# Patient Record
Sex: Female | Born: 1995 | Race: Black or African American | Hispanic: No | Marital: Single | Smoking: Current some day smoker
Health system: Southern US, Community
[De-identification: ages and names within clinical notes are randomized; demographics above are authoritative.]

## PROBLEM LIST (undated history)

## (undated) ENCOUNTER — Inpatient Hospital Stay (HOSPITAL_COMMUNITY): Payer: Self-pay

## (undated) ENCOUNTER — Inpatient Hospital Stay (HOSPITAL_COMMUNITY): Payer: Medicaid Other

## (undated) DIAGNOSIS — N390 Urinary tract infection, site not specified: Secondary | ICD-10-CM

## (undated) DIAGNOSIS — J45909 Unspecified asthma, uncomplicated: Secondary | ICD-10-CM

## (undated) DIAGNOSIS — O26899 Other specified pregnancy related conditions, unspecified trimester: Secondary | ICD-10-CM

## (undated) HISTORY — PX: NO PAST SURGERIES: SHX2092

## (undated) HISTORY — DX: Other specified pregnancy related conditions, unspecified trimester: O26.899

---
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
## 2015-01-12 ENCOUNTER — Ambulatory Visit: Payer: Self-pay | Admitting: Family

## 2015-03-05 ENCOUNTER — Encounter (HOSPITAL_COMMUNITY): Payer: Self-pay | Admitting: Emergency Medicine

## 2015-03-05 ENCOUNTER — Emergency Department (HOSPITAL_COMMUNITY)
Admission: EM | Admit: 2015-03-05 | Discharge: 2015-03-05 | Disposition: A | Discharge status: Home / Self Care | Payer: Medicaid Other | Attending: Emergency Medicine | Admitting: Emergency Medicine

## 2015-03-05 DIAGNOSIS — K029 Dental caries, unspecified: Secondary | ICD-10-CM | POA: Insufficient documentation

## 2015-03-05 DIAGNOSIS — Z87440 Personal history of urinary (tract) infections: Secondary | ICD-10-CM | POA: Diagnosis not present

## 2015-03-05 DIAGNOSIS — K047 Periapical abscess without sinus: Secondary | ICD-10-CM | POA: Diagnosis not present

## 2015-03-05 DIAGNOSIS — K0889 Other specified disorders of teeth and supporting structures: Secondary | ICD-10-CM | POA: Diagnosis present

## 2015-03-05 DIAGNOSIS — Z87891 Personal history of nicotine dependence: Secondary | ICD-10-CM | POA: Diagnosis not present

## 2015-03-05 DIAGNOSIS — J45909 Unspecified asthma, uncomplicated: Secondary | ICD-10-CM | POA: Diagnosis not present

## 2015-03-05 MED ORDER — AMOXICILLIN 250 MG PO CAPS
500.0000 mg | ORAL_CAPSULE | Freq: Once | ORAL | Status: AC
  Administered 2015-03-05: 500 mg via ORAL
  Filled 2015-03-05: qty 2

## 2015-03-05 MED ORDER — AMOXICILLIN 500 MG PO CAPS: 500.0000 mg | ORAL_CAPSULE | Freq: Three times a day (TID) | ORAL | Status: AC

## 2015-03-05 MED ORDER — TRAMADOL HCL 50 MG PO TABS
50.0000 mg | ORAL_TABLET | Freq: Once | ORAL | Status: AC
  Administered 2015-03-05: 50 mg via ORAL
  Filled 2015-03-05: qty 1

## 2015-03-05 MED ORDER — TRAMADOL HCL 50 MG PO TABS: 50.0000 mg | ORAL_TABLET | Freq: Four times a day (QID) | ORAL | Status: DC | PRN

## 2015-03-05 NOTE — ED Provider Notes (Signed)
CSN: [id number]     Arrival date & time 03/05/15  4098 History   First MD Initiated Contact with Patient 03/05/15 1058     Chief Complaint  Patient presents with  . Dental Pain     (Consider location/radiation/quality/duration/timing/severity/associated sxs/prior Treatment) The history is provided by the patient.   Ashley Morris is a 19 y.o. female presenting with right upper dental pain which has been severe for the past several days.  She is 3 months post partum (not breast feeding) and had to hold off with dental care while pregnant and really had no significant pain symptoms until now.  She denies fevers, chills, headache, drainage from around the tooth but has noticed increased gingival swelling.  She also has a cavity in the left upper side but is not currently causing symptoms.  She has taken tylenol without pain relief.    Past Medical History  Diagnosis Date  . Asthma   . Urinary tract infection    Past Surgical History  Procedure Laterality Date  . No past surgeries     Family History  Problem Relation Age of Onset  . Hypertension Mother   . Anemia Mother    Social History  Substance Use Topics  . Smoking status: Former Smoker    Types: Cigarettes    Quit date: 11/08/2012  . Smokeless tobacco: Never Used  . Alcohol Use: No   OB History    Gravida Para Term Preterm AB TAB SAB Ectopic Multiple Living   0 2     Review of Systems  Constitutional: Negative for fever.  HENT: Positive for dental problem. Negative for facial swelling and sore throat.   Respiratory: Negative for shortness of breath.   Musculoskeletal: Negative for neck pain and neck stiffness.      Allergies  Review of patient's allergies indicates no known allergies.  Home Medications   Prior to Admission medications   Medication Sig Start Date End Date Taking? Authorizing Provider  acetaminophen (TYLENOL) 500 MG tablet Take 1,000 mg by mouth every 6 (six) hours as needed  for mild pain.   Yes Historical Provider, MD  amoxicillin (AMOXIL) 500 MG capsule Take 1 capsule (500 mg total) by mouth 3 (three) times daily. 03/05/15 03/15/15  Burgess Amor, PA-C  ibuprofen (ADVIL,MOTRIN) 600 MG tablet Take 1 tablet (600 mg total) by mouth every 6 (six) hours. Patient not taking: Reported on 03/05/2015 12/01/14   Aviva Signs, CNM  traMADol (ULTRAM) 50 MG tablet Take 1 tablet (50 mg total) by mouth every 6 (six) hours as needed. 03/05/15   Burgess Amor, PA-C   BP 140/85 mmHg  Pulse 90  Temp(Src) 98.3 F (36.8 C) (Oral)  Resp 16  Ht  (1.702 m)  Wt 182 lb 9.6 oz (82.827 kg)  BMI 28.59 kg/m2  SpO2 100% Physical Exam  Constitutional: She is oriented to person, place, and time. She appears well-developed and well-nourished. No distress.  HENT:  Head: Normocephalic and atraumatic.  Right Ear: Tympanic membrane and external ear normal.  Left Ear: Tympanic membrane and external ear normal.  Mouth/Throat: Oropharynx is clear and moist and mucous membranes are normal. No oral lesions. No trismus in the jaw. Dental abscesses present.    Occlusive surface cavities bilateral 1st upper molar teeth.  Right upper lateral gingival edema.  Eyes: Conjunctivae are normal.  Neck: Normal range of motion. Neck supple.  Cardiovascular: Normal rate and normal heart sounds.  Pulmonary/Chest: Effort normal.  Abdominal: She exhibits no distension.  Musculoskeletal: Normal range of motion.  Lymphadenopathy:    She has no cervical adenopathy.  Neurological: She is alert and oriented to person, place, and time.  Skin: Skin is warm and dry. No erythema.  Psychiatric: She has a normal mood and affect.    ED Course  Procedures (including critical care time) Labs Review Labs Reviewed - No data to display  Imaging Review No results found. I have personally reviewed and evaluated these images and lab results as part of my medical decision-making.   EKG Interpretation None       MDM   Final diagnoses:  Dental cavities    Multiple cavities with probable early infection #2 molar. Amoxil, tramadol prescribed.  She is scheduled to see a dentist in [place] first week of Nov.  Prn f/u anticipated.  No trismus, no abscess at this time.  The patient appears reasonably screened and/or stabilized for discharge and I doubt any other medical condition or other [facility] requiring further screening, evaluation, or treatment in the ED at this time prior to discharge.     Burgess AmorJulie Floye Fesler, PA-C 03/05/15 1638  Benjiman CoreNathan Pickering, MD 03/06/15 1256

## 2015-03-05 NOTE — Discharge Instructions (Signed)
Dental Caries Dental caries is tooth decay. This decay can cause a hole in teeth (cavity) that can get bigger and deeper over time. HOME CARE  Brush and floss your teeth. Do this at least two times a day.  Use a fluoride toothpaste.  Use a mouth rinse if told by your dentist or doctor.  Eat less sugary and starchy foods. Drink less sugary drinks.  Avoid snacking often on sugary and starchy foods. Avoid sipping often on sugary drinks.  Keep regular checkups and cleanings with your dentist.  Use fluoride supplements if told by your dentist or doctor.  Allow fluoride to be applied to teeth if told by your dentist or doctor.   This information is not intended to replace advice given to you by your health care provider. Make sure you discuss any questions you have with your health care provider.   As discussed,  I suspect you do have an early dental abscess given your increased pain.  Look for dental putty at the drug store which can help relieve your dental nerve pain until you can be seen by your new dentist.  You may take the tramadol prescribed for pain relief.  This will make you drowsy - do not drive within 4 hours of taking this medication.

## 2015-03-05 NOTE — ED Notes (Signed)
Pt reports dental pain in upper right and left teeth stating she has "holes in them."  This has been going on for months.  Pt has inc pain today, has root canal on Thursday, but not on teeth that she is c/o today.

## 2015-03-23 ENCOUNTER — Encounter: Payer: Self-pay | Admitting: *Deleted

## 2015-04-06 IMAGING — US US OB COMP LESS 14 WK
1 series · 14 of 24 positions shown · non-contrast
Comparison: None.

CLINICAL DATA: Sudden onset of right-sided abdominal pain.

OBSTETRIC <14 WK ULTRASOUND
TECHNIQUE: Transabdominal ultrasound was performed for evaluation
of the gestation as well as the maternal uterus and adnexal
regions.

[Series 1: us ob comp less 14 wk · 0.30mm/px · 14 of 24 slices shown]
[im 1/24]
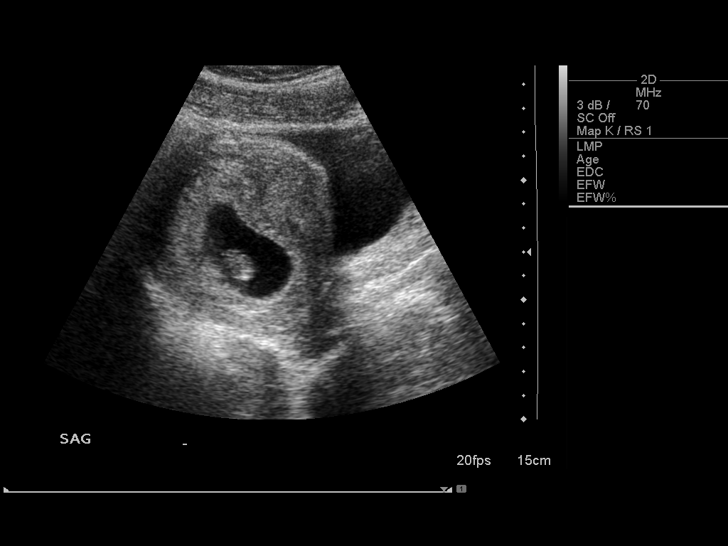
[im 3/24]
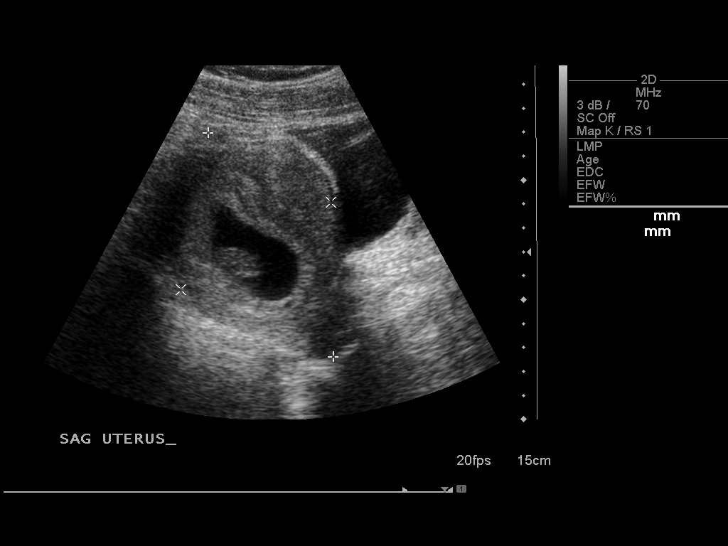
[im 5/24]
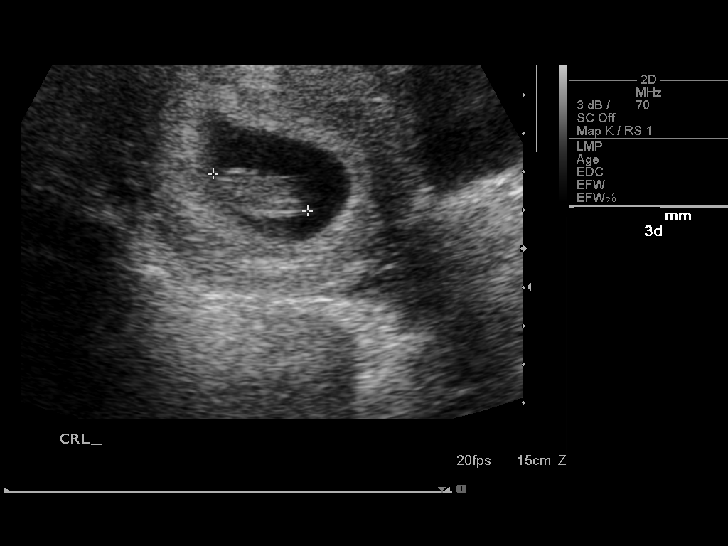
[im 7/24]
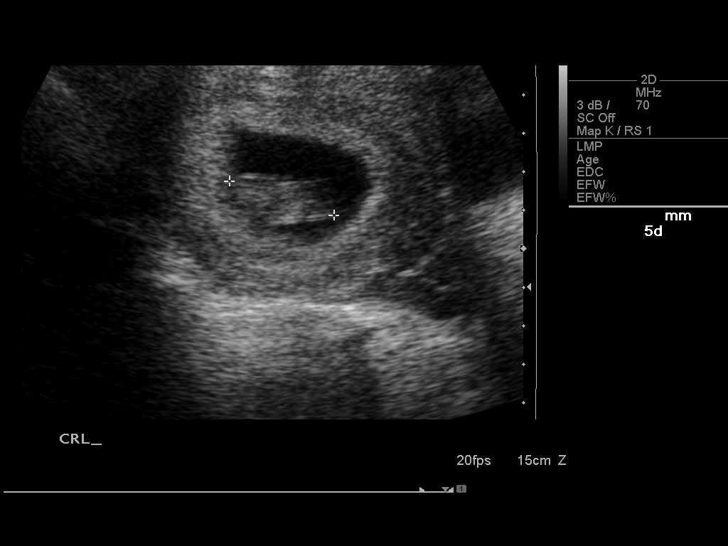
[im 8/24]
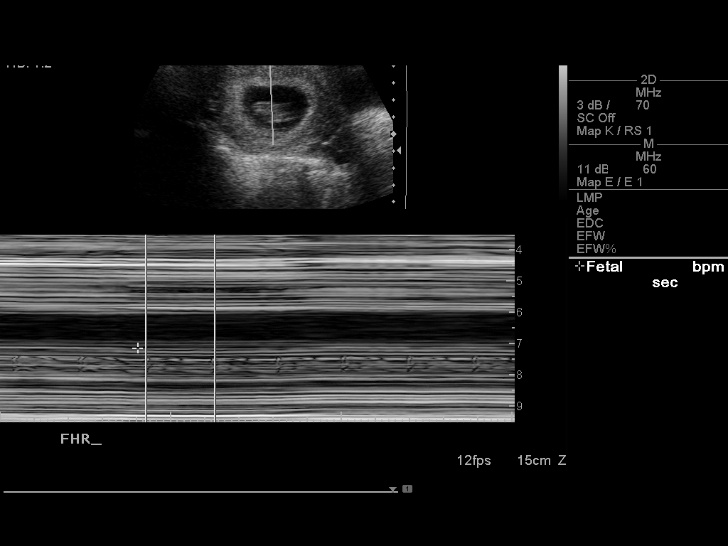
[im 10/24]
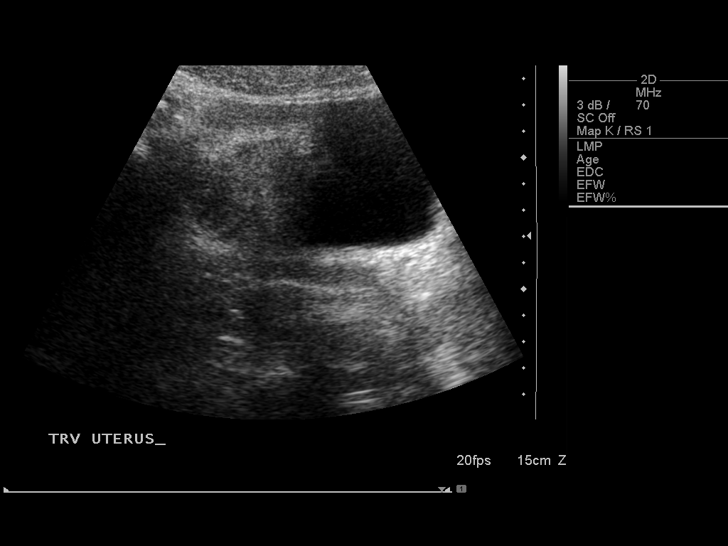
[im 12/24]
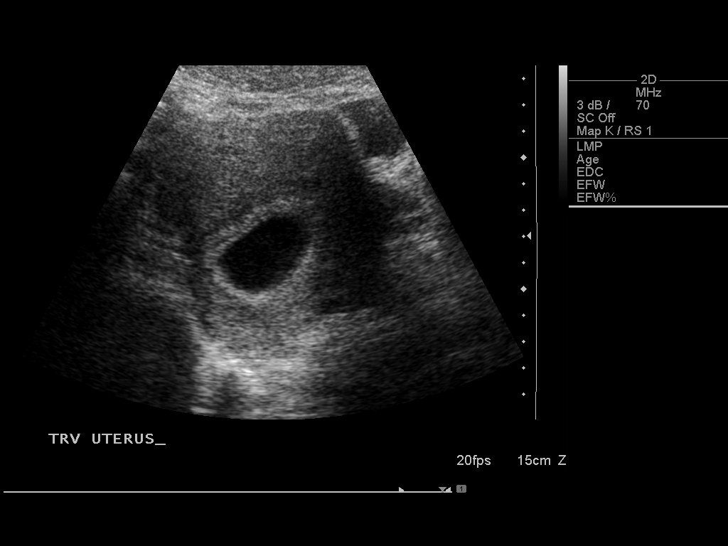
[im 13/24]
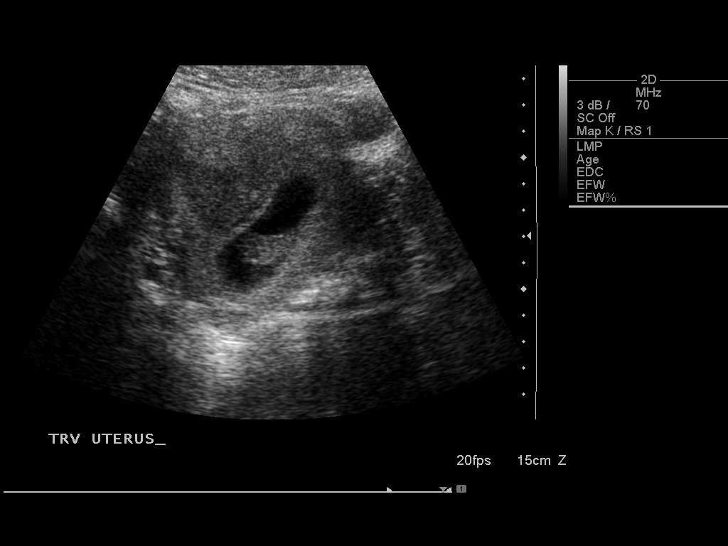
[im 15/24]
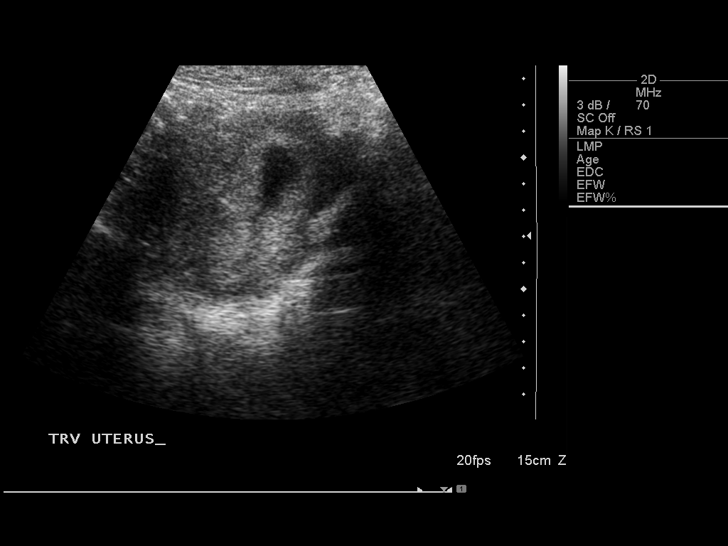
[im 17/24]
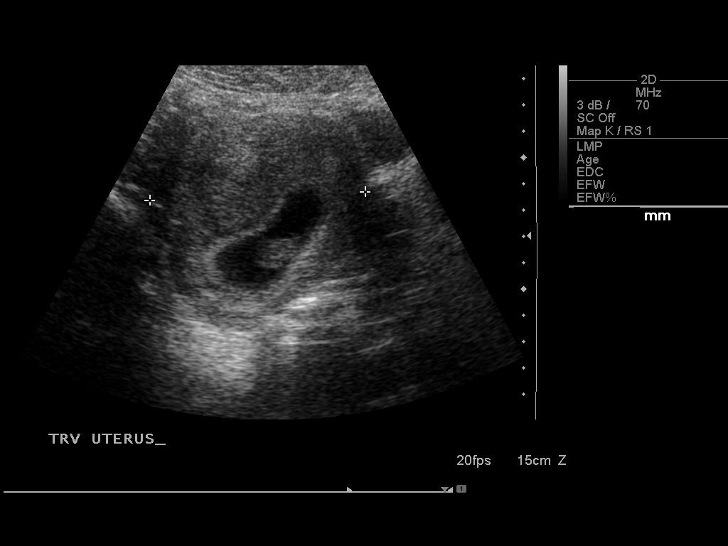
[im 19/24]
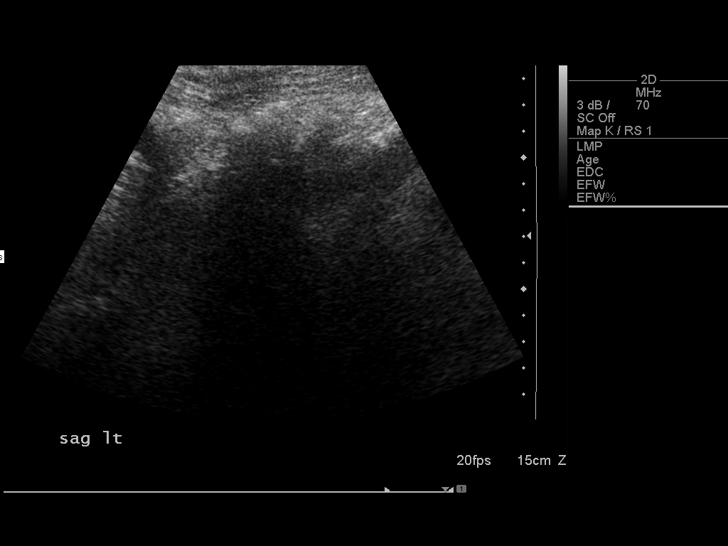
[im 20/24]
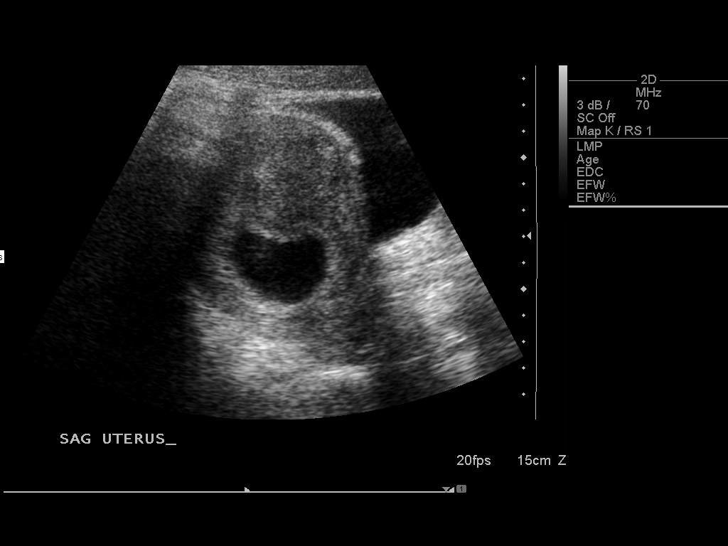
[im 22/24]
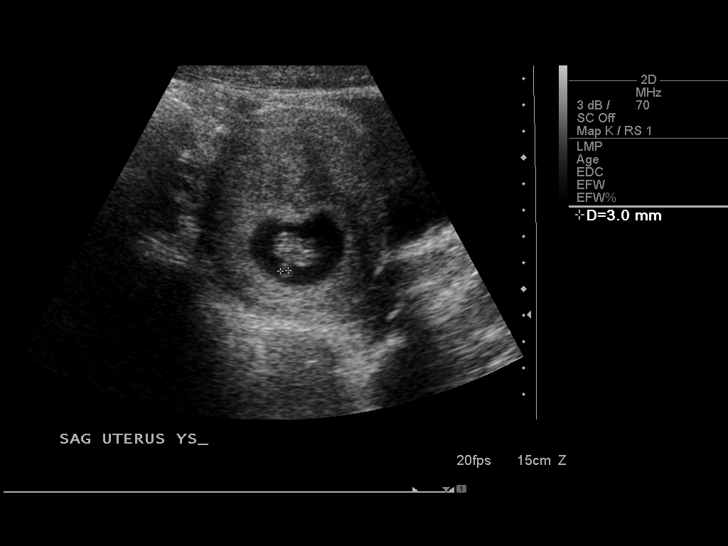
[im 24/24]
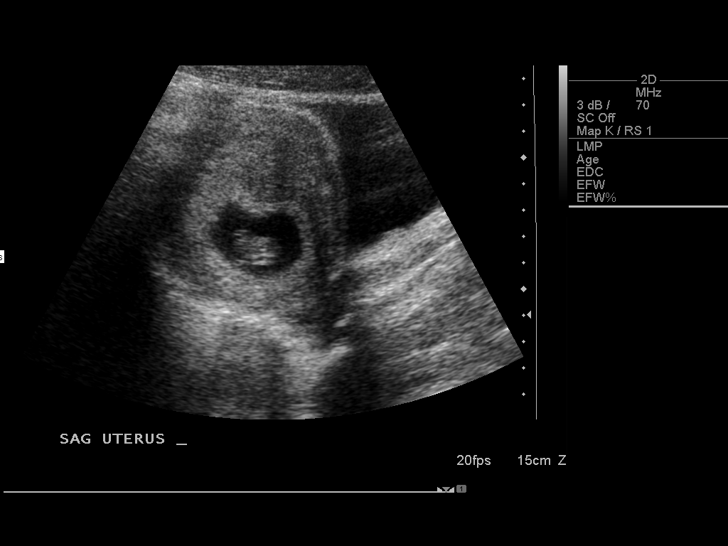

[14 of 24 positions shown; findings below may reference images not displayed]

Intrauterine gestational sac: Visualized/normal in shape.
Yolk sac: Yes
Embryo: Yes
Cardiac Activity: Yes
Heart Rate: 149 bpm

CRL:  27.5 mm  9 w  4 d       US EDC: 07/02/2013

Maternal uterus/Adnexae:
No subchorionic hemorrhage is noted.  The uterus is unremarkable in
appearance, measuring 10.7 x 7.3 x 8.2 cm.

The ovaries are not visualized on the study.  No suspicious adnexal
masses are seen; there is no evidence to suggest ovarian torsion.

No free fluid is seen in the pelvic cul-de-sac.
IMPRESSION: Single live intrauterine pregnancy noted, with a crown-rump length
of 2.8 cm, corresponding to a gestational age of 9 weeks 4 days.
This matches the gestational age of 9 weeks 2 days by LMP,
reflecting an estimated date of delivery July 04, 2013.

## 2015-06-03 IMAGING — US US ABDOMEN COMPLETE
1 series · 14 of 25 positions shown · non-contrast
Comparison: 12/01/2012.

CLINICAL DATA: Right flank pain.

ABDOMINAL ULTRASOUND COMPLETE

[Series 1: us abdomen complete · 0.26mm/px · 14 of 47 slices shown]
[im 1/47]
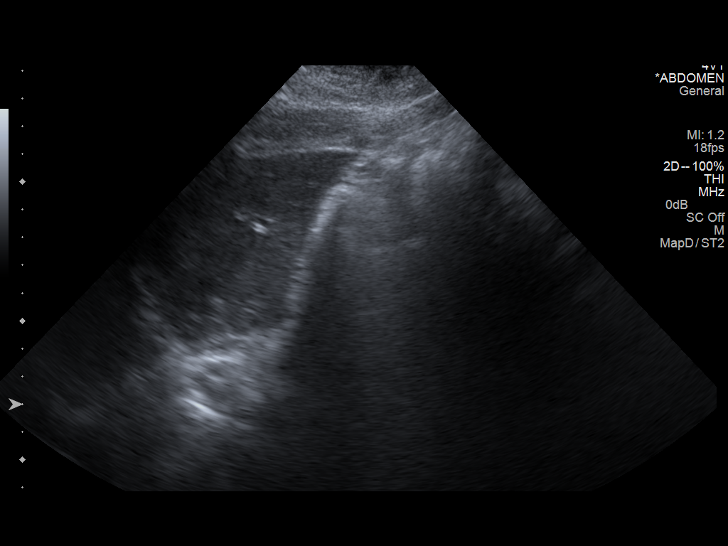
[im 4/47]
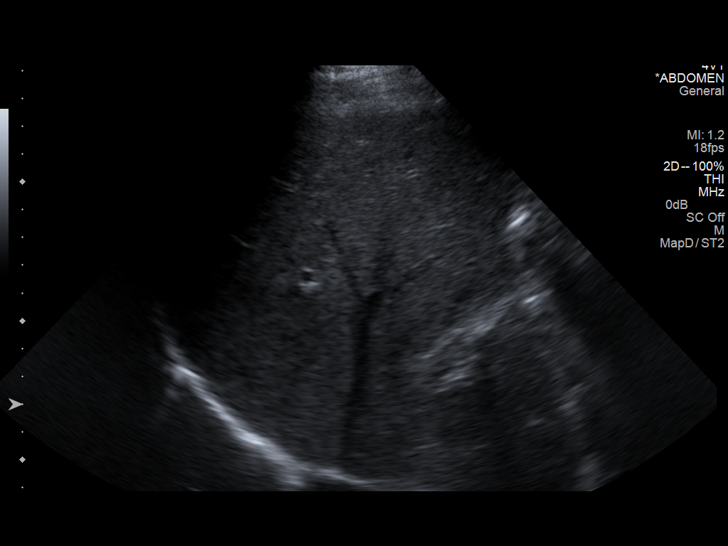
[im 8/47]
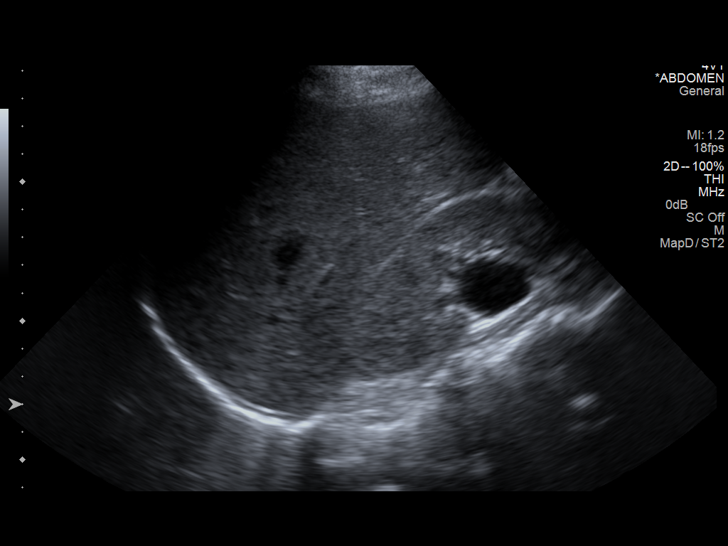
[im 12/47]
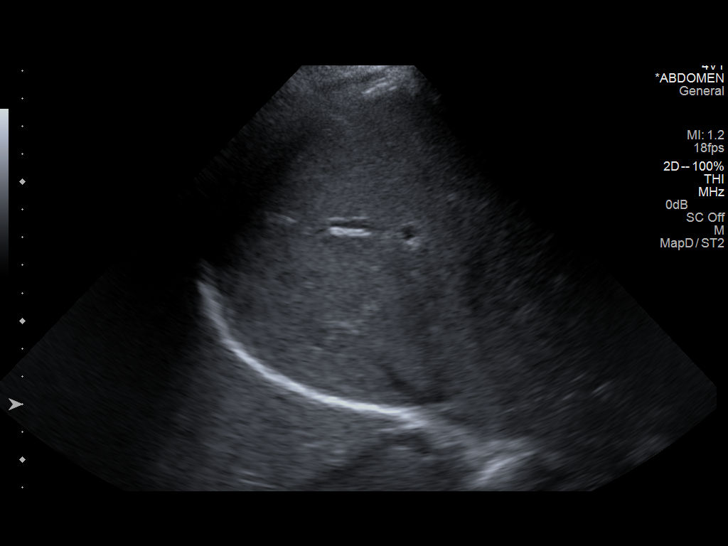
[im 16/47]
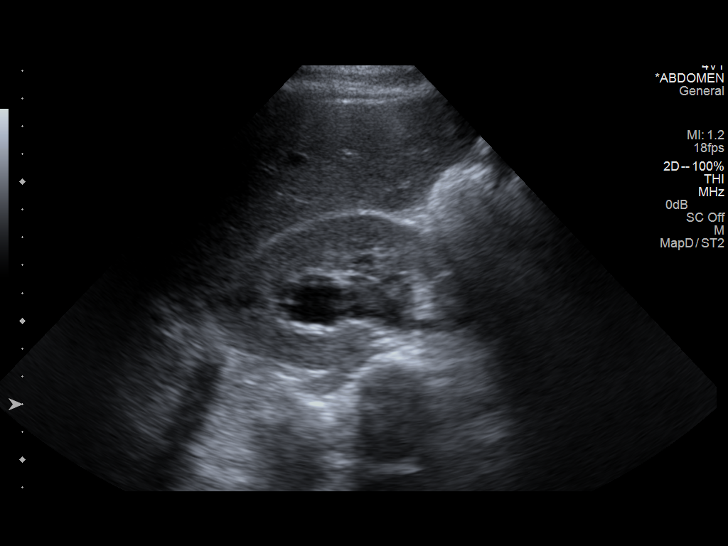
[im 18/47]
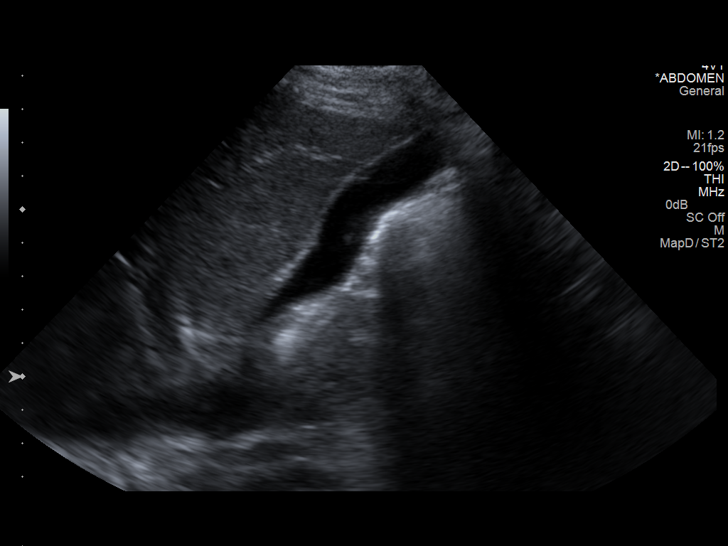
[im 22/47]
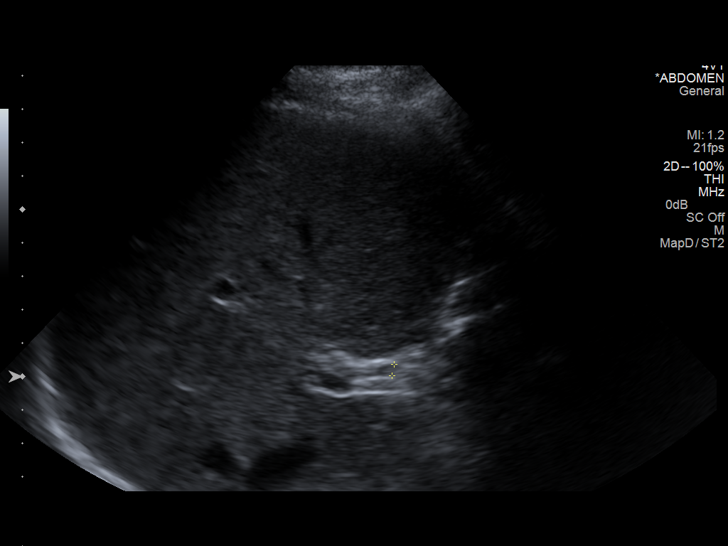
[im 25/47]
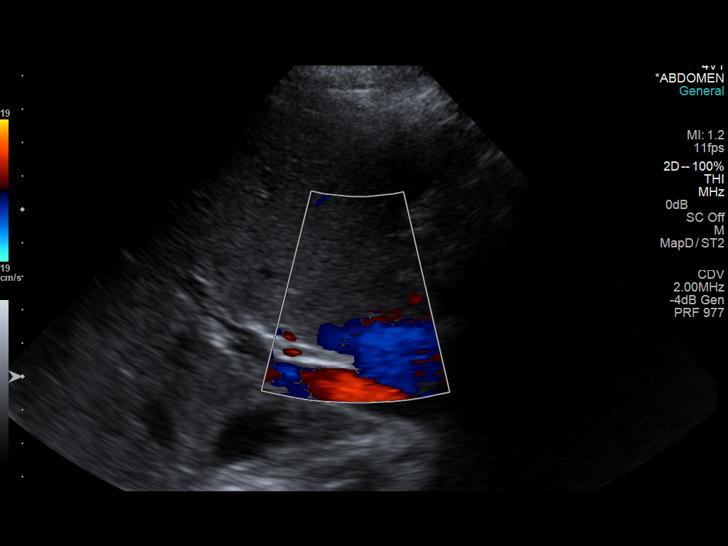
[im 29/47]
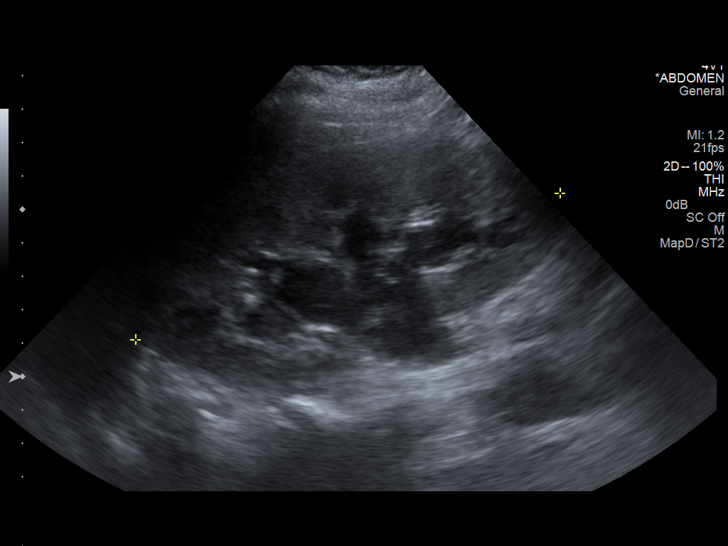
[im 31/47]
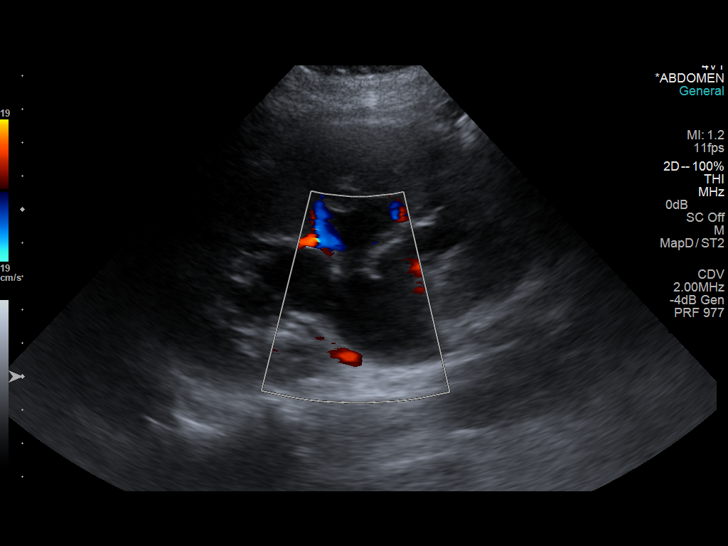
[im 35/47]
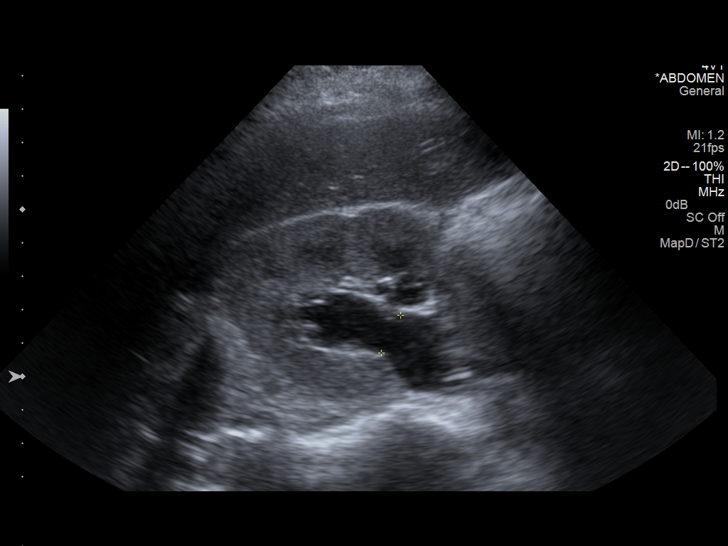
[im 39/47]
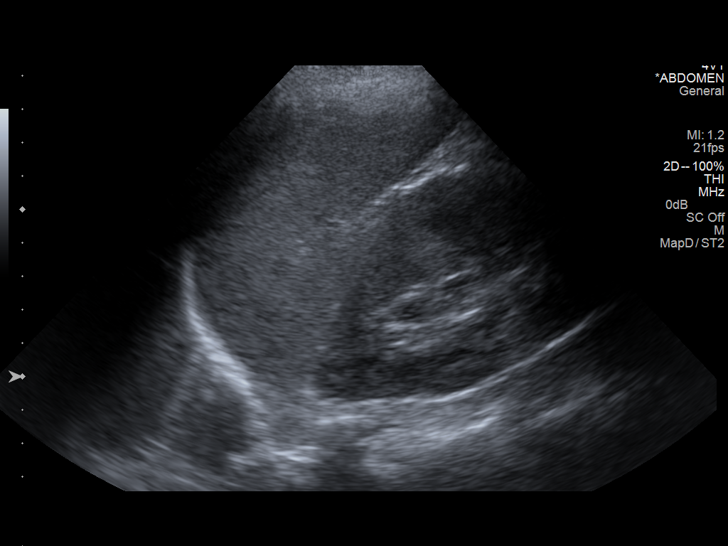
[im 43/47]
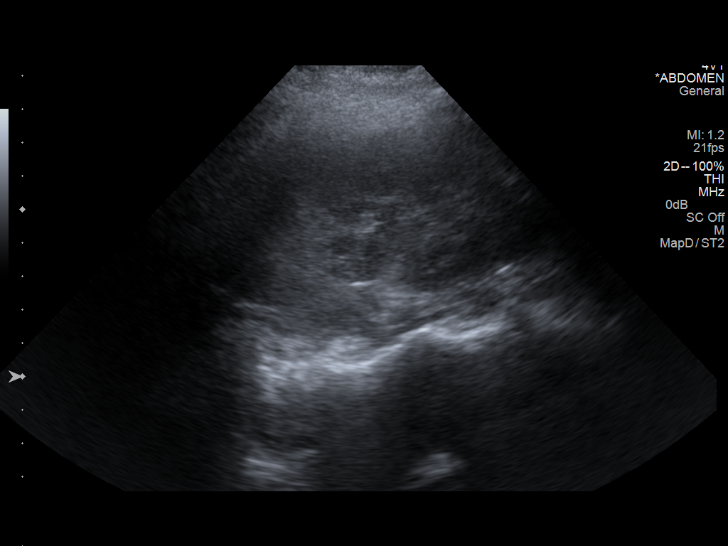
[im 47/47]
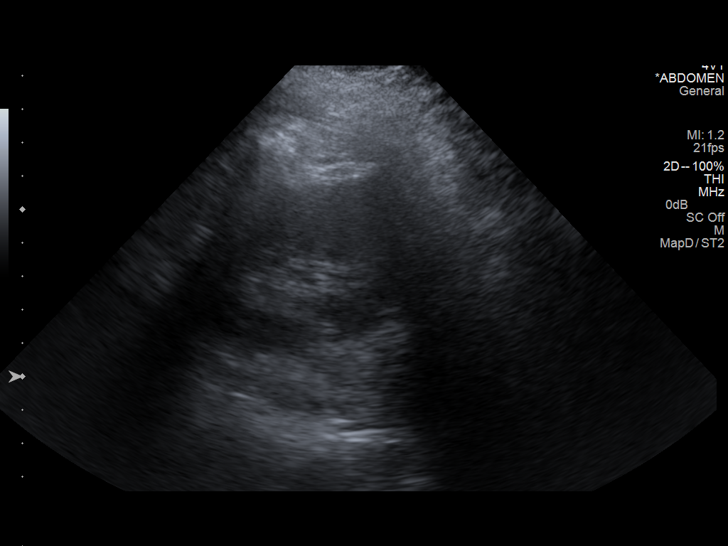

[14 of 25 positions shown; findings below may reference images not displayed]

FINDINGS: Gallbladder:  No gallstones, gallbladder wall thickening, or
pericholecystic fluid.

Common Bile Duct:  Within normal limits in caliber.

Liver: No focal mass lesion identified.  Within normal limits in
parenchymal echogenicity.

IVC:  Appears normal.

Pancreas:  No abnormality identified.

Spleen:  Within normal limits in size and echotexture.

Right kidney:  Measures 13.5 cm.  The right kidney is increased in
echogenicity and there is moderate right hydronephrosis. No
evidence of mass or hydronephrosis.

Left kidney:  Measures 11.4 cm.  Normal in size and parenchymal
echogenicity.  No evidence of mass or hydronephrosis.

Abdominal Aorta:  No aneurysm identified.
IMPRESSION: 1.  Right-sided hydronephrosis.

## 2015-06-03 IMAGING — US US OB LIMITED
1 series · 14 of 25 positions shown · non-contrast
Comparison: none

CLINICAL DATA: Abdominal pain.

EXAM:
LIMITED OBSTETRIC ULTRASOUND

[Series 1: us ob limited · 0.22mm/px · 14 of 25 slices shown]
[im 1/25]
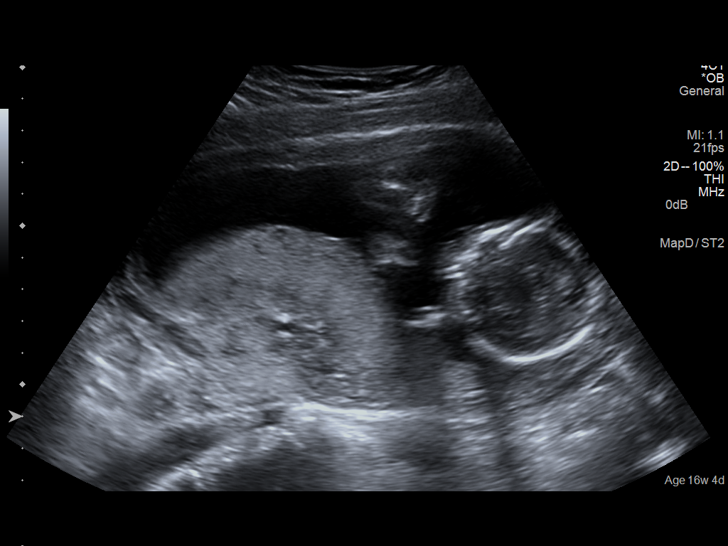
[im 3/25]
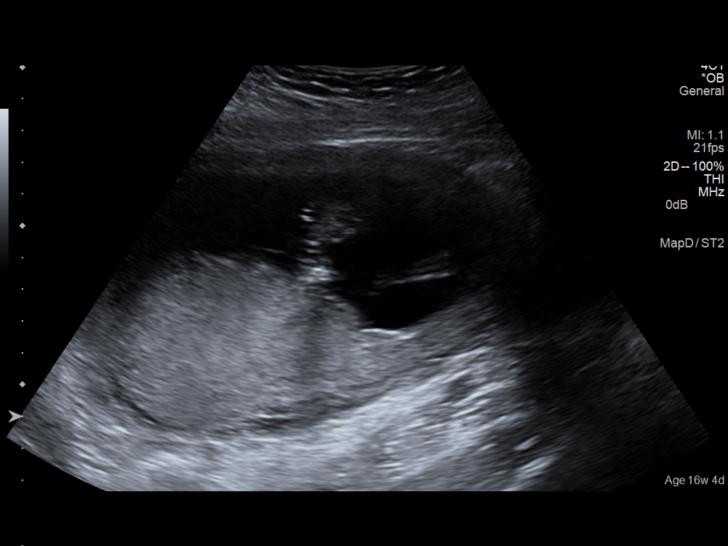
[im 5/25]
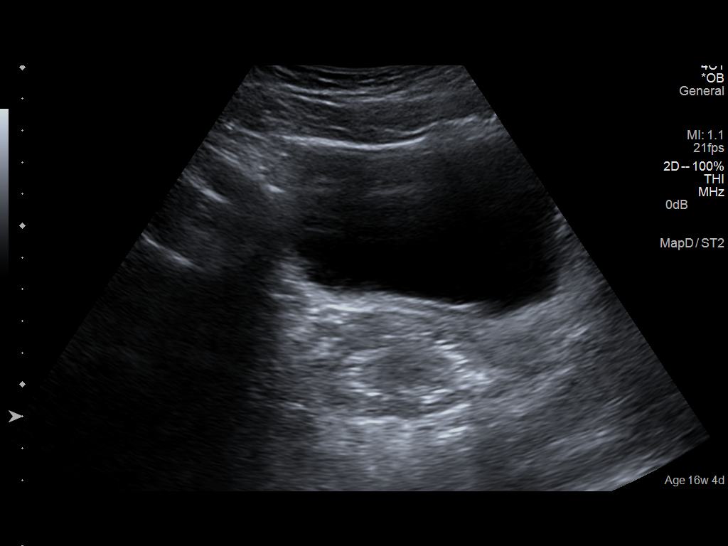
[im 7/25]
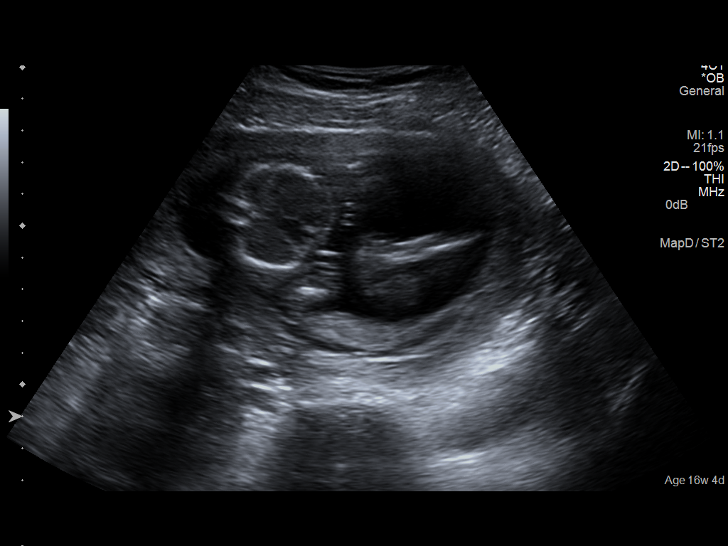
[im 9/25]
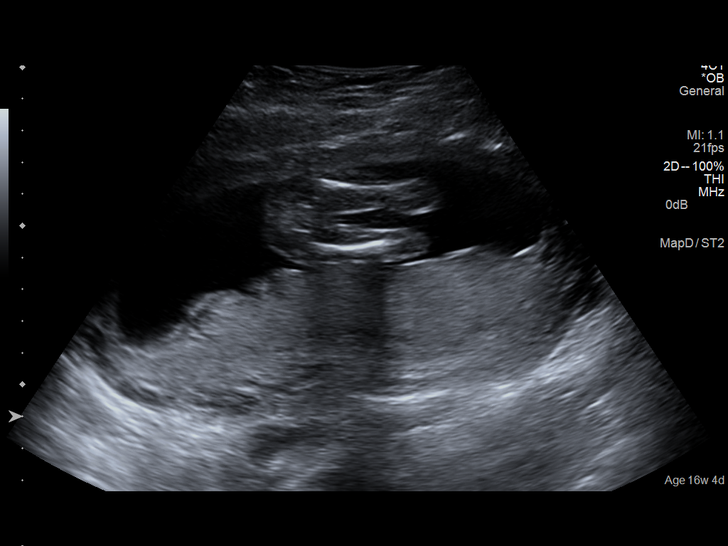
[im 10/25]
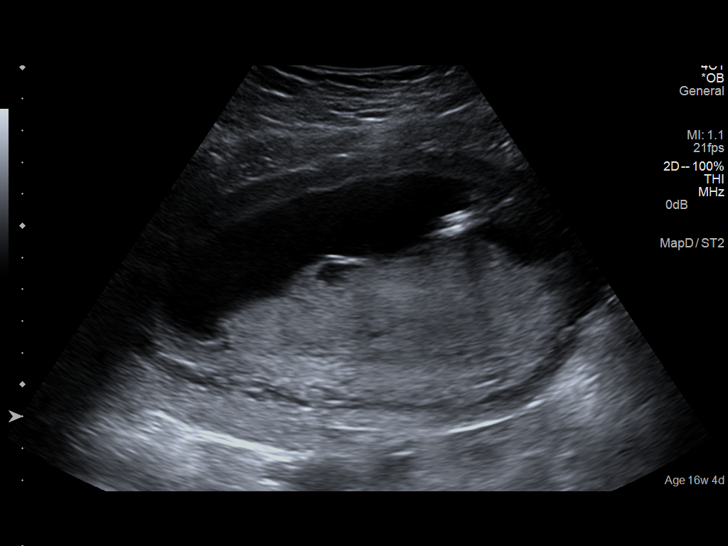
[im 12/25]
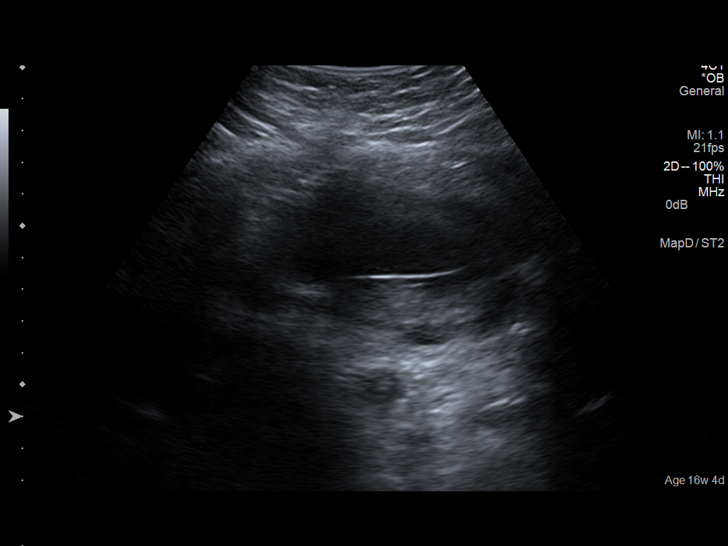
[im 14/25]
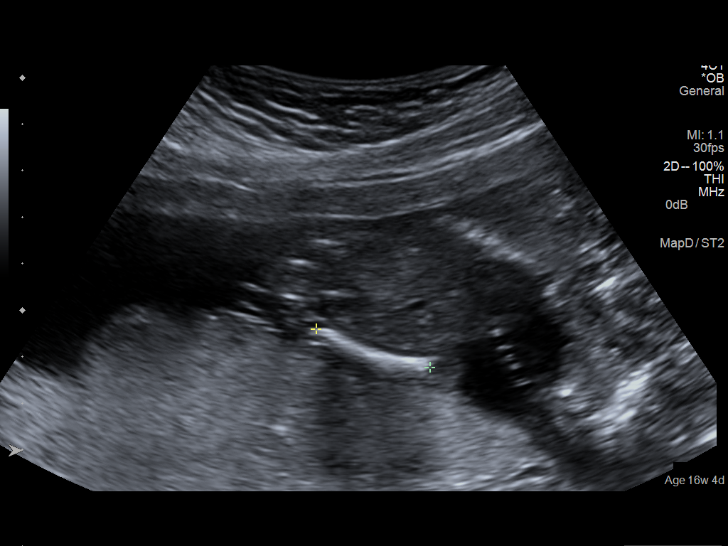
[im 16/25]
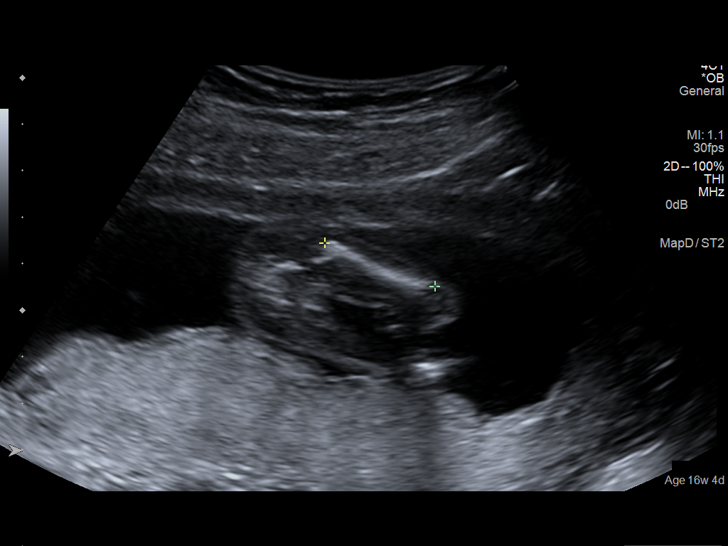
[im 17/25]
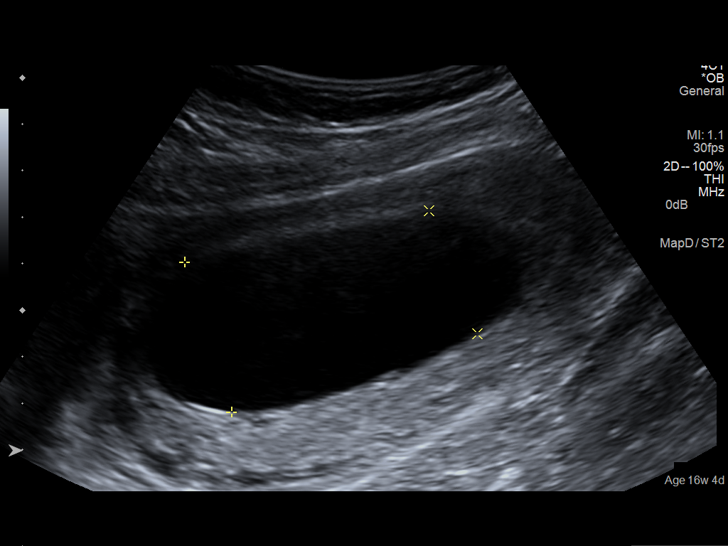
[im 19/25]
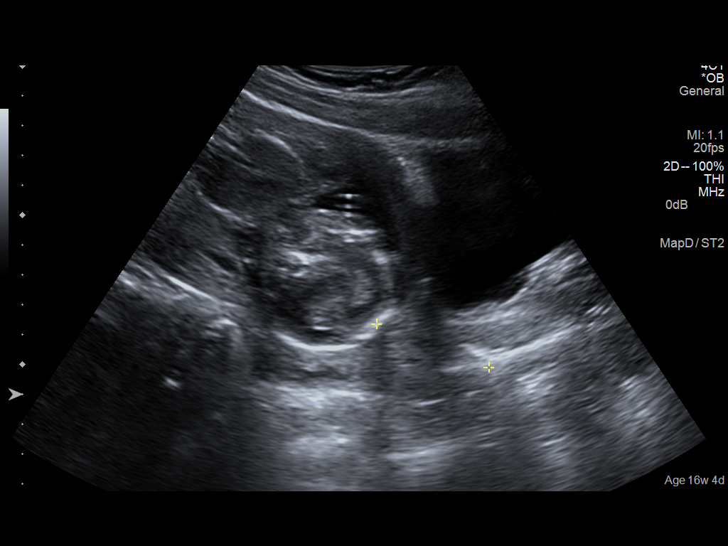
[im 21/25]
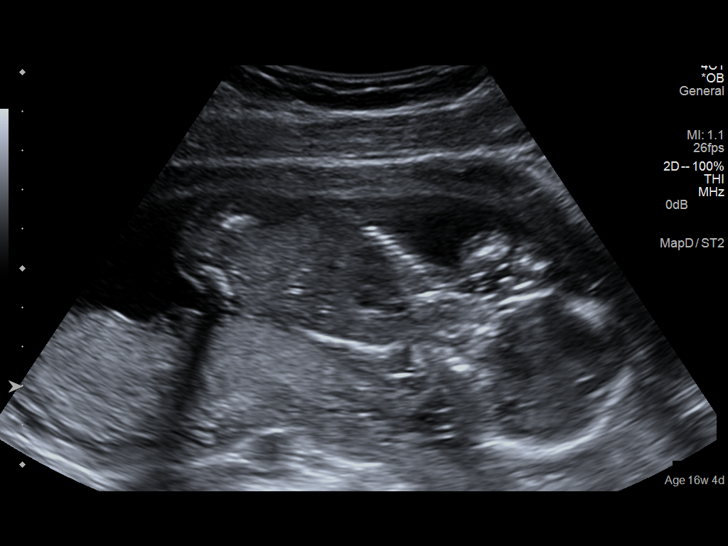
[im 23/25]
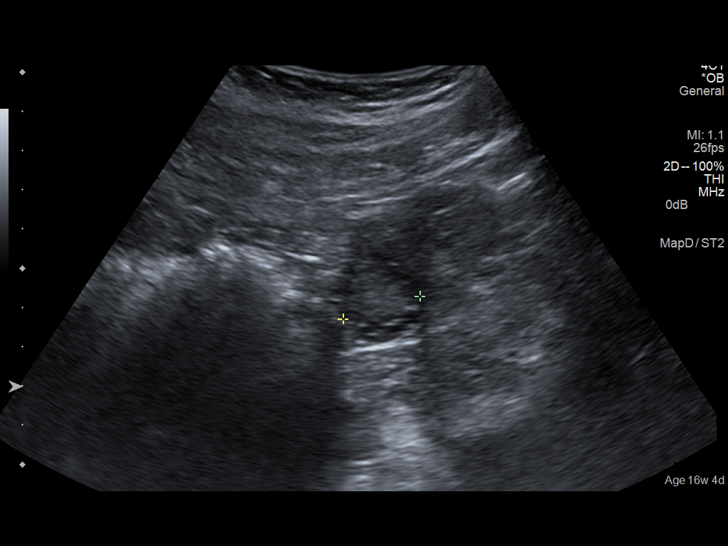
[im 25/25]
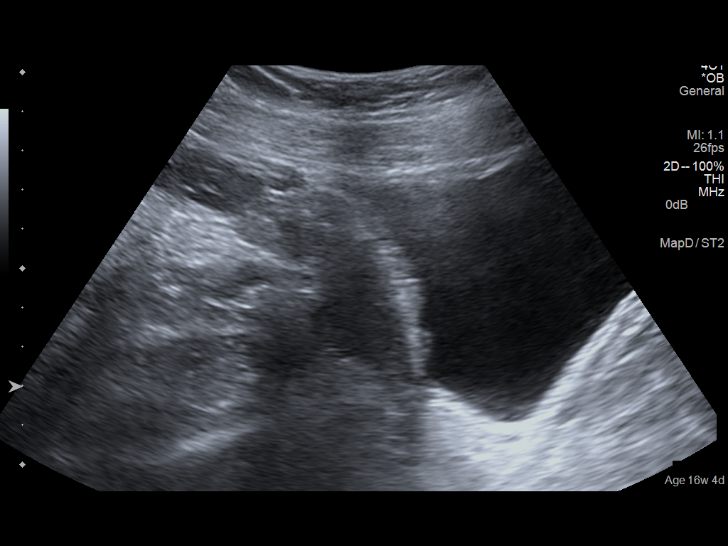

[14 of 25 positions shown; findings below may reference images not displayed]

FINDINGS: Number of Fetuses: 1

Heart Rate:  143 bpm

Movement: Yes

Presentation: Cephalic

Placental Location: Posterior

Previa: No

Amniotic Fluid (Subjective):  Within normal limits.

FL:  2.56cm 17w 5d

MATERNAL FINDINGS:

Cervix:  Appears closed.

Uterus/Adnexae:  No abnormality visualized.
IMPRESSION: Single living IUP.  No acute maternal findings visualized.

This exam is performed on an emergent basis and does not
comprehensively evaluate fetal size, dating, or anatomy; follow-up
complete OB US should be considered if further fetal assessment is
warranted.

## 2015-06-09 IMAGING — US US OB COMP +14 WK
1 series · 12 of 28 positions shown · non-contrast
Comparison: none

[Series 1: us ob comp +14 wk · 12 of 90 slices shown]
[im 4/90]
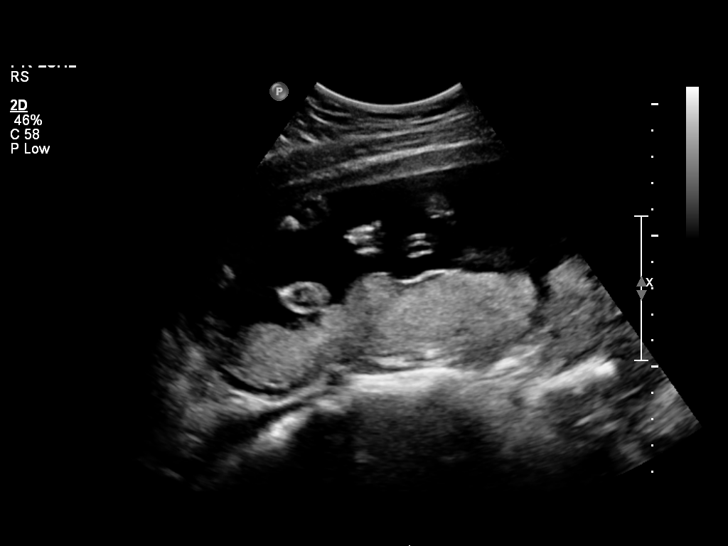
[im 10/90]
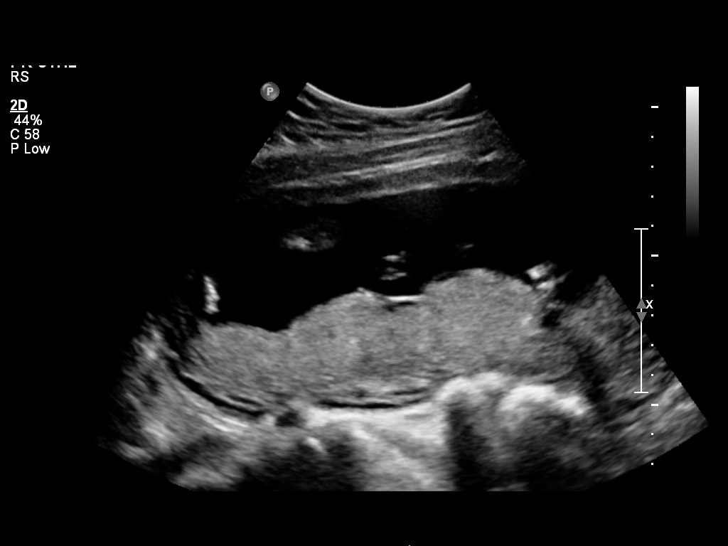
[im 17/90]
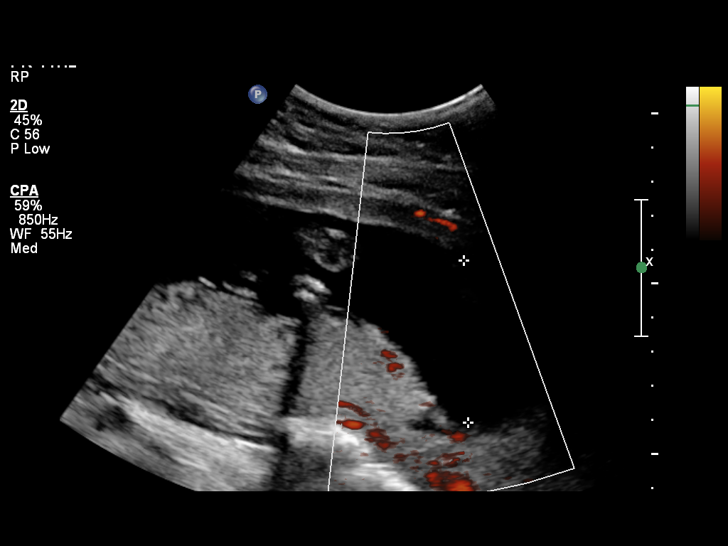
[im 27/90]
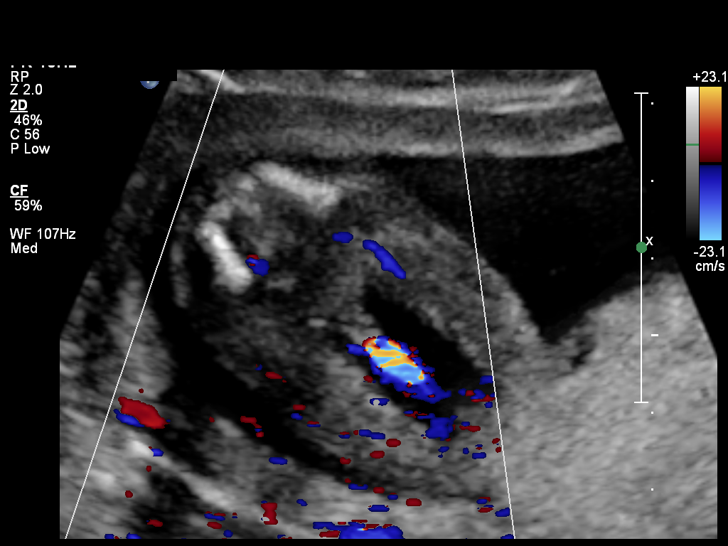
[im 33/90]
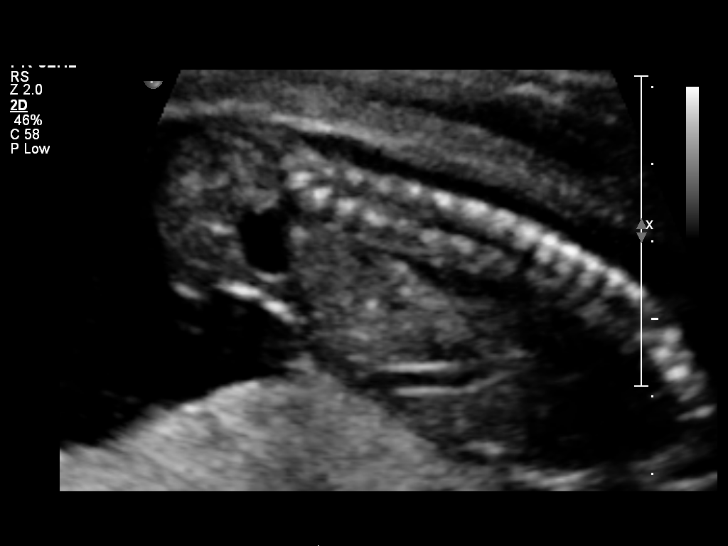
[im 40/90]
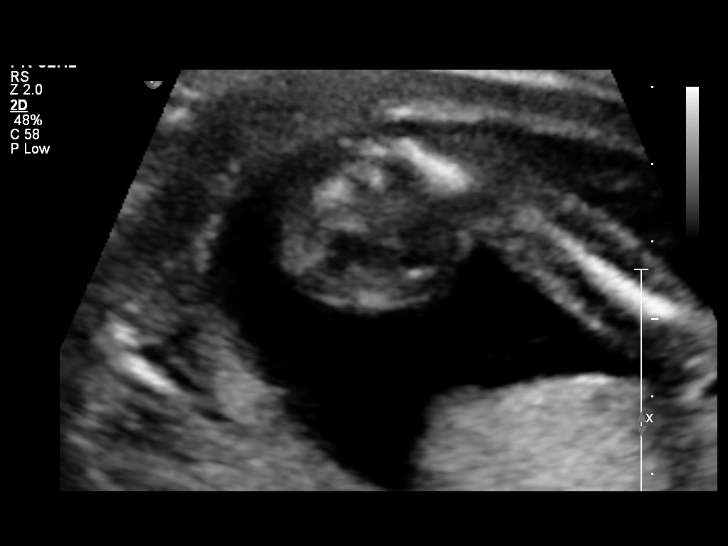
[im 50/90]
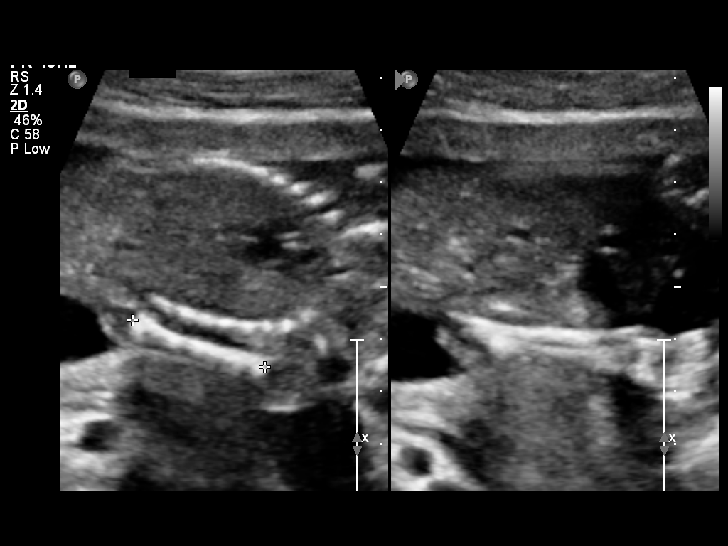
[im 57/90]
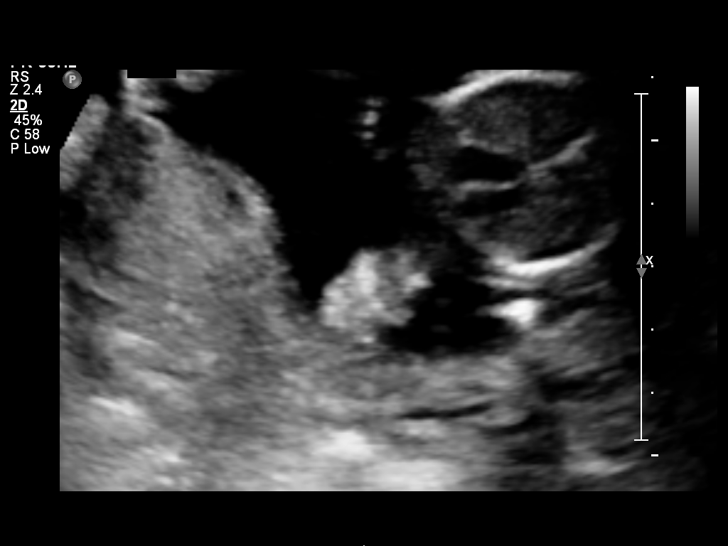
[im 63/90]
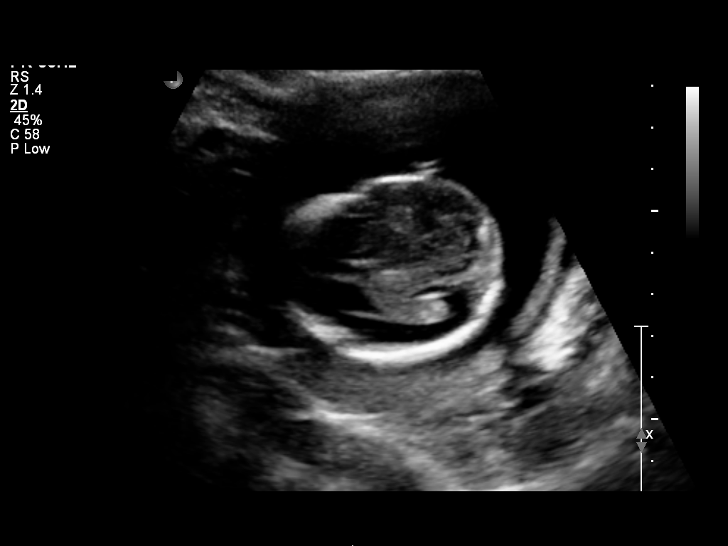
[im 73/90]
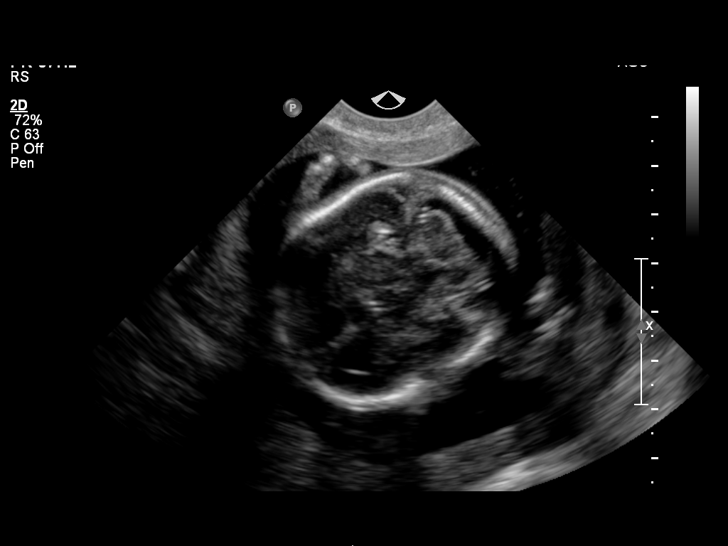
[im 80/90]
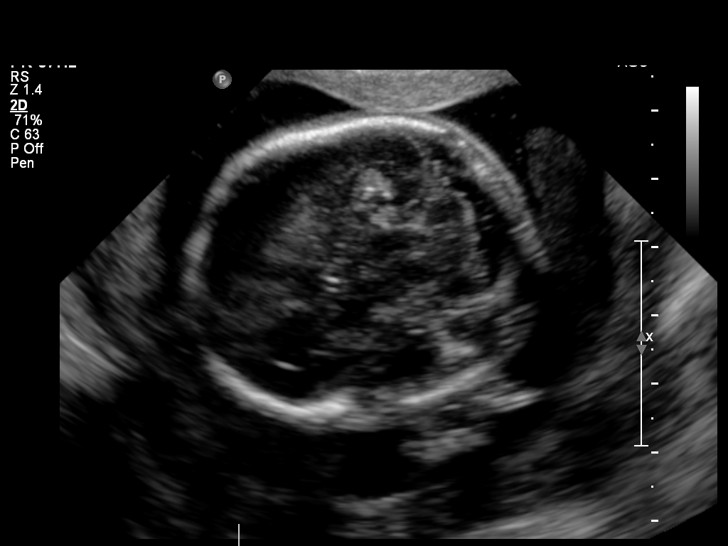
[im 86/90]
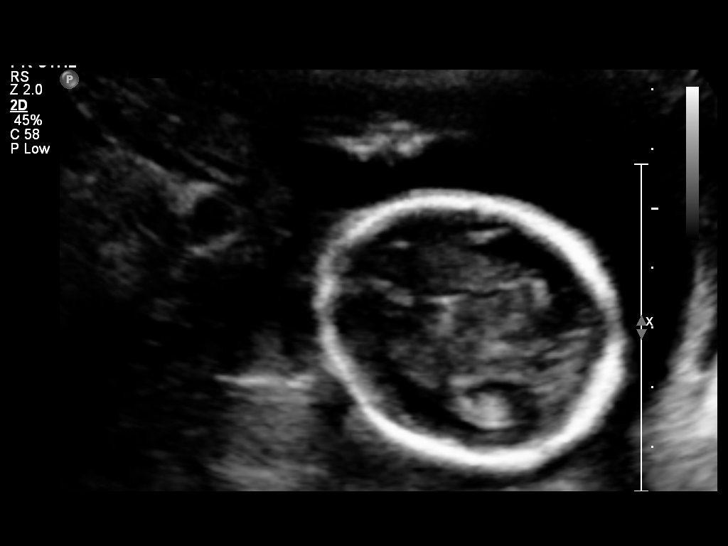

[12 of 28 positions shown; findings below may reference images not displayed]

OBSTETRICS REPORT
                      (Signed Final 02/03/2013 [DATE])

Service(s) Provided

 US OB COMP + 14 WK                                    76805.1
 US OB TRANSVAGINAL                                    76817.0
Indications

 Basic anatomic survey

Fetal Evaluation

 Num Of Fetuses:    1
 Fetal Heart Rate:  144                          bpm
 Cardiac Activity:  Observed
 Presentation:      Cephalic
 Placenta:          Posterior, above cervical
                    os
 P. Cord            Visualized, central
 Insertion:

 Amniotic Fluid
 AFI FV:      Subjectively within normal limits
                                             Larg Pckt:    4.74  cm
Gestational Age

 Best:          18w 2d     Det. By:  Early Ultrasound         EDD:   07/05/13
                                     (11/15/12)
Anatomy

 Cranium:          Appears normal         Aortic Arch:      Appears normal
 Fetal Cavum:      Not well visualized    Ductal Arch:      Basic anatomy
                                                            exam per order
 Ventricles:       Appears normal         Diaphragm:        Appears normal
 Choroid Plexus:   Appears normal         Stomach:          Appears normal
 Cerebellum:       Appears normal         Abdomen:          Appears normal
 Posterior Fossa:  Appears normal         Abdominal Wall:   Appears nml (cord
                                                            insert, abd wall)
 Nuchal Fold:      Appears normal         Cord Vessels:     Appears normal (3
                                                            vessel cord)
 Face:             Appears normal         Kidneys:          Appear normal
                   (orbits and profile)
 Lips:             Appears normal         Bladder:          Appears normal
 Heart:            Not well visualized    Spine:            Appears normal
 RVOT:             Not well visualized    Lower             Appears normal
                                          Extremities:
 LVOT:             Not well visualized    Upper             Appears normal
                                          Extremities:

 Other:  Heels visualized.  Rt 5th visualized.  Fetus appears to be a male.
         Technically difficult due to fetal position.
Cervix Uterus Adnexa

 Cervical Length:    3.58     cm

 Cervix:       Normal appearance by transabdominal scan. Normal
               appearance by transvaginal scan
 Uterus:       No abnormality visualized.
 Cul De Sac:   No free fluid seen.

 Left Ovary:    Not visualized.
 Right Ovary:   Within normal limits.
 Adnexa:     No abnormality visualized.
Impression

 Single IUP at 18 [DATE] weeks
 Normal fetal anatomic survey; however, limited views of the
 fetal heart obtained due to fetal position
 No markers associated with aneuploidy noted
 Normal amniotic fluid volume
Recommendations

 Recommend follow-up ultrasound examination in 4 weeks to
 reevaluate fetal heart anatomy

 questions or concerns.

## 2015-12-04 ENCOUNTER — Encounter (HOSPITAL_COMMUNITY): Payer: Self-pay | Admitting: Emergency Medicine

## 2015-12-04 ENCOUNTER — Emergency Department (HOSPITAL_COMMUNITY)
Admission: EM | Admit: 2015-12-04 | Discharge: 2015-12-04 | Disposition: A | Discharge status: Home / Self Care | Payer: Medicaid Other | Attending: Dermatology | Admitting: Dermatology

## 2015-12-04 DIAGNOSIS — R102 Pelvic and perineal pain: Secondary | ICD-10-CM | POA: Insufficient documentation

## 2015-12-04 DIAGNOSIS — J45909 Unspecified asthma, uncomplicated: Secondary | ICD-10-CM | POA: Diagnosis not present

## 2015-12-04 DIAGNOSIS — F17210 Nicotine dependence, cigarettes, uncomplicated: Secondary | ICD-10-CM | POA: Insufficient documentation

## 2015-12-04 DIAGNOSIS — Z5321 Procedure and treatment not carried out due to patient leaving prior to being seen by health care provider: Secondary | ICD-10-CM | POA: Insufficient documentation

## 2015-12-04 DIAGNOSIS — M79621 Pain in right upper arm: Secondary | ICD-10-CM | POA: Insufficient documentation

## 2015-12-04 DIAGNOSIS — N939 Abnormal uterine and vaginal bleeding, unspecified: Secondary | ICD-10-CM | POA: Insufficient documentation

## 2015-12-04 LAB — URINALYSIS, ROUTINE W REFLEX MICROSCOPIC
BILIRUBIN URINE: NEGATIVE
GLUCOSE, UA: NEGATIVE mg/dL
HGB URINE DIPSTICK: NEGATIVE
KETONES UR: NEGATIVE mg/dL
Leukocytes, UA: NEGATIVE
Nitrite: NEGATIVE
PROTEIN: NEGATIVE mg/dL
Specific Gravity, Urine: 1.027 (ref 1.005–1.030)
pH: 6 (ref 5.0–8.0)

## 2015-12-04 LAB — POC URINE PREG, ED: PREG TEST UR: NEGATIVE

## 2015-12-04 NOTE — ED Notes (Signed)
Reported to nurse first stating she was going to leave. Encouraged to stay. Decided to leave.

## 2015-12-04 NOTE — ED Notes (Addendum)
C/o "spotting blood with brown discharge" and pelvic pain x 3 days.  States her LMP was suppose to start on 7/7.  Also reports R upper arm pain at location of nexplanon.  States she had her menstrual cycle twice last month.

## 2016-06-02 ENCOUNTER — Emergency Department (HOSPITAL_COMMUNITY)
Admission: EM | Admit: 2016-06-02 | Discharge: 2016-06-02 | Discharge status: Against Medical Advice | Payer: Medicaid Other | Attending: Emergency Medicine | Admitting: Emergency Medicine

## 2016-06-02 ENCOUNTER — Encounter (HOSPITAL_COMMUNITY): Payer: Self-pay | Admitting: Emergency Medicine

## 2016-06-02 DIAGNOSIS — M546 Pain in thoracic spine: Secondary | ICD-10-CM | POA: Insufficient documentation

## 2016-06-02 DIAGNOSIS — J45909 Unspecified asthma, uncomplicated: Secondary | ICD-10-CM | POA: Insufficient documentation

## 2016-06-02 DIAGNOSIS — Y939 Activity, unspecified: Secondary | ICD-10-CM | POA: Insufficient documentation

## 2016-06-02 DIAGNOSIS — F17210 Nicotine dependence, cigarettes, uncomplicated: Secondary | ICD-10-CM | POA: Insufficient documentation

## 2016-06-02 DIAGNOSIS — Y929 Unspecified place or not applicable: Secondary | ICD-10-CM | POA: Insufficient documentation

## 2016-06-02 DIAGNOSIS — Y999 Unspecified external cause status: Secondary | ICD-10-CM | POA: Insufficient documentation

## 2016-06-02 LAB — PREGNANCY, URINE: Preg Test, Ur: NEGATIVE

## 2016-06-02 MED ORDER — IBUPROFEN 400 MG PO TABS
600.0000 mg | ORAL_TABLET | Freq: Once | ORAL | Status: AC
  Administered 2016-06-02: 600 mg via ORAL
  Filled 2016-06-02: qty 1

## 2016-06-02 MED ORDER — METHOCARBAMOL 500 MG PO TABS
1000.0000 mg | ORAL_TABLET | Freq: Once | ORAL | Status: AC
  Administered 2016-06-02: 1000 mg via ORAL
  Filled 2016-06-02: qty 2

## 2016-06-02 NOTE — ED Triage Notes (Signed)
Pt at desk asking for DC papers. Pt reported you are not doing anything for me.

## 2016-06-02 NOTE — ED Triage Notes (Signed)
PT reports unable to void. Pt provided with a cup of water.
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
## 2016-06-02 NOTE — ED Triage Notes (Signed)
Pt provided with soda to drink . A urine sample at bed side. Pt had not notified staff that urine sample was ready.

## 2016-06-02 NOTE — ED Triage Notes (Signed)
PT talking  On phone unable to asses.

## 2016-06-02 NOTE — ED Triage Notes (Signed)
Pt up to bath room ans was unable to collect urine sample.
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
## 2017-01-26 ENCOUNTER — Encounter (HOSPITAL_COMMUNITY): Payer: Self-pay | Admitting: *Deleted

## 2017-01-26 DIAGNOSIS — F17210 Nicotine dependence, cigarettes, uncomplicated: Secondary | ICD-10-CM | POA: Insufficient documentation

## 2017-01-26 DIAGNOSIS — J45909 Unspecified asthma, uncomplicated: Secondary | ICD-10-CM | POA: Insufficient documentation

## 2017-01-26 DIAGNOSIS — B9789 Other viral agents as the cause of diseases classified elsewhere: Secondary | ICD-10-CM | POA: Insufficient documentation

## 2017-01-26 DIAGNOSIS — J069 Acute upper respiratory infection, unspecified: Secondary | ICD-10-CM | POA: Insufficient documentation

## 2017-01-26 DIAGNOSIS — R51 Headache: Secondary | ICD-10-CM | POA: Insufficient documentation

## 2017-01-26 NOTE — ED Triage Notes (Signed)
The pt is c/o cold cough headache for 3 days productive greenish

## 2017-01-27 ENCOUNTER — Emergency Department (HOSPITAL_COMMUNITY)
Admission: EM | Admit: 2017-01-27 | Discharge: 2017-01-27 | Disposition: A | Discharge status: Home / Self Care | Payer: Self-pay | Attending: Emergency Medicine | Admitting: Emergency Medicine

## 2017-01-27 ENCOUNTER — Emergency Department (HOSPITAL_COMMUNITY): Payer: Self-pay

## 2017-01-27 DIAGNOSIS — J069 Acute upper respiratory infection, unspecified: Secondary | ICD-10-CM

## 2017-01-27 DIAGNOSIS — B9789 Other viral agents as the cause of diseases classified elsewhere: Secondary | ICD-10-CM

## 2017-01-27 MED ORDER — ACETAMINOPHEN-CODEINE #3 300-30 MG PO TABS
2.0000 | ORAL_TABLET | Freq: Once | ORAL | Status: AC
  Administered 2017-01-27: 2 via ORAL
  Filled 2017-01-27: qty 2

## 2017-01-27 MED ORDER — ACETAMINOPHEN-CODEINE #3 300-30 MG PO TABS: 1.0000 | ORAL_TABLET | Freq: Four times a day (QID) | ORAL | 0 refills | Status: DC | PRN

## 2017-01-27 MED ORDER — BENZONATATE 100 MG PO CAPS: 100.0000 mg | ORAL_CAPSULE | Freq: Three times a day (TID) | ORAL | 0 refills | Status: DC

## 2017-01-27 NOTE — ED Provider Notes (Signed)
[facility] Provider Note   CSN: [id number] Arrival date & time: 01/26/17  2215     History   Chief Complaint Chief Complaint  Patient presents with  . Cough    HPI Ashley Morris is a 21 y.o. female.  HPI Pt with cc of cough. Pt reports 3 days of cough. Pt is producing green phlegm. Pt has no chest pain, but when she cough she does have some chest discomfort and her head hurts. Cough is getting worse. Pt also reports some congestion, sore throat.  Past Medical History:  Diagnosis Date  . Asthma   . Urinary tract infection     Patient Active Problem List   Diagnosis Date Noted  . Active labor at term 11/29/2014  . Abdominal pain in pregnancy, antepartum   . Non-reactive NST (non-stress test)   . Noncompliant pregnant patient in third trimester, antepartum 10/07/2014  . Decreased fetal movement affecting management of mother, antepartum 10/07/2014  . Supervision of normal pregnancy in second trimester 06/17/2014  . 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 NO PAST SURGERIES      OB History    Gravida Para Term Preterm AB Living   3 2 2   1 2    SAB TAB Ectopic Multiple Live Births   1     0 2       Home Medications    Prior to Admission medications   Not on File    Family History Family History  Problem Relation Age of Onset  . Hypertension Mother   . Anemia Mother     Social History Social History  Substance Use Topics  . Smoking status: Current Every Day Smoker    Types: Cigarettes  . Smokeless tobacco: Never Used  . Alcohol use Yes     Allergies   Patient has no known allergies.   Review of Systems Review of Systems  Constitutional: Negative for activity change.  HENT: Positive for congestion and sore throat. Negative for trouble swallowing.   Respiratory: Positive for cough.   Cardiovascular: Positive for chest pain.     Physical Exam Updated Vital Signs BP 107/62 (BP Location:  Right Arm)   Pulse 69   Temp 98.2 F (36.8 C) (Oral)   Resp 16   Ht 5\' 7"  (1.702 m)   Wt 79.4 kg (175 lb)   LMP 01/26/2017   SpO2 100%   BMI 27.41 kg/m   Physical Exam  Constitutional: She is oriented to person, place, and time. She appears well-developed and well-nourished.  HENT:  Head: Normocephalic and atraumatic.  Mouth/Throat: Oropharynx is clear and moist. No oropharyngeal exudate.  Eyes: Conjunctivae and EOM are normal.  Neck: Normal range of motion. Neck supple.  Cardiovascular: Normal rate and regular rhythm.   Pulmonary/Chest: Effort normal and breath sounds normal. No respiratory distress.  Abdominal: Soft. Bowel sounds are normal.  Lymphadenopathy:    She has no cervical adenopathy.  Neurological: She is alert and oriented to person, place, and time. No cranial nerve deficit.  Skin: Skin is warm and dry.  Nursing note and vitals reviewed.    ED Treatments / Results  Labs (all labs ordered are listed, but only abnormal results are displayed) Labs Reviewed - No data to display  EKG  EKG Interpretation None       Radiology No results found.  Procedures Procedures (including critical care time)  Medications Ordered in ED Medications  acetaminophen-codeine (TYLENOL #3)  300-30 MG per tablet 2 tablet (not administered)     Initial Impression / Assessment and Plan / ED Course  I have reviewed the triage vital signs and the nursing notes.  Pertinent labs & imaging results that were available during my care of the patient were reviewed by me and considered in my medical decision making (see chart for details).     Pt comes in with cc of cough. Pt is having green phlegm, some pleuritic component to the pain and also headaches with cough. Pt has no associated neuro complains.  Concern for viral syndrome, but due to the pleurisy and green phlem, we will get CXR to see if there is any infiltrate.  Final Clinical Impressions(s) / ED Diagnoses   Final  diagnoses:  None    New Prescriptions New Prescriptions   No medications on file     Derwood Kaplan, MD 01/27/17 [phone]

## 2017-01-27 NOTE — Discharge Instructions (Signed)
We saw you in the ER for the cough. We think what you have is a viral syndrome - the treatment for which is symptomatic relief only, and your body will fight the infection off in a few days. We are prescribing you some meds for pain and fevers. See your primary care doctor in 1 week if the symptoms dont improve.

## 2017-04-07 ENCOUNTER — Other Ambulatory Visit: Payer: Self-pay

## 2017-04-07 ENCOUNTER — Encounter (HOSPITAL_COMMUNITY): Payer: Self-pay

## 2017-04-07 ENCOUNTER — Ambulatory Visit (HOSPITAL_COMMUNITY)
Admission: EM | Admit: 2017-04-07 | Discharge: 2017-04-07 | Disposition: A | Discharge status: Home / Self Care | Payer: Self-pay | Attending: Family Medicine | Admitting: Family Medicine

## 2017-04-07 DIAGNOSIS — R05 Cough: Secondary | ICD-10-CM

## 2017-04-07 DIAGNOSIS — R059 Cough, unspecified: Secondary | ICD-10-CM

## 2017-04-07 NOTE — ED Provider Notes (Signed)
[facility]    CSN: [id number] Arrival date & time: 04/07/17  1500     History   Chief Complaint Chief Complaint  Patient presents with  . Cough    HPI Ashley Morris is a 21 y.o. female.   The history is provided by the patient. No language interpreter was used.  Cough  Cough characteristics:  Productive Sputum characteristics:  Nondescript Severity:  Moderate Onset quality:  Gradual Duration:  1 day Timing:  Constant Progression:  Worsening Chronicity:  New Smoker: no   Context: upper respiratory infection   Relieved by:  Nothing Worsened by:  Nothing Ineffective treatments:  None tried Associated symptoms: no chest pain and no fever   Pt complains of cough for 2 days.  Pt reports she does not feel sick.   Past Medical History:  Diagnosis Date  . Asthma   . Urinary tract infection     Patient Active Problem List   Diagnosis Date Noted  . Active labor at term 11/29/2014  . Abdominal pain in pregnancy, antepartum   . Non-reactive NST (non-stress test)   . Noncompliant pregnant patient in third trimester, antepartum 10/07/2014  . Decreased fetal movement affecting management of mother, antepartum 10/07/2014  . Supervision of normal pregnancy in second trimester 06/17/2014  . 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 NO PAST SURGERIES      OB History    Gravida Para Term Preterm AB Living   3 2 2   1 2    SAB TAB Ectopic Multiple Live Births   1     0 2       Home Medications    Prior to Admission medications   Medication Sig Start Date End Date Taking? Authorizing Provider  acetaminophen-codeine (TYLENOL #3) 300-30 MG tablet Take 1 tablet by mouth every 6 (six) hours as needed for moderate pain. 01/27/17   Derwood KaplanNanavati, Ankit, MD  benzonatate (TESSALON) 100 MG capsule Take 1 capsule (100 mg total) by mouth every 8 (eight) hours. 01/27/17   Derwood KaplanNanavati, Ankit, MD    Family History Family History    Problem Relation Age of Onset  . Hypertension Mother   . Anemia Mother     Social History Social History   Tobacco Use  . Smoking status: Current Every Day Smoker    Types: Cigarettes  . Smokeless tobacco: Never Used  Substance Use Topics  . Alcohol use: Yes  . Drug use: Yes    Types: Marijuana     Allergies   Patient has no known allergies.   Review of Systems Review of Systems  Constitutional: Negative for fever.  Respiratory: Positive for cough.   Cardiovascular: Negative for chest pain.  All other systems reviewed and are negative.    Physical Exam Triage Vital Signs ED Triage Vitals  Enc Vitals Group     BP 04/07/17 1525 117/79     Pulse Rate 04/07/17 1525 64     Resp 04/07/17 1525 16     Temp 04/07/17 1525 (!) 97.3 F (36.3 C)     Temp Source 04/07/17 1525 Oral     SpO2 04/07/17 1525 99 %     Weight --      Height --      Head Circumference --      Peak Flow --      Pain Score 04/07/17 1523 7     Pain Loc --      Pain  Edu? --      Excl. in [facility]? --    No data found.  Updated Vital Signs BP 117/79 (BP Location: Left Arm)   Pulse 64   Temp (!) 97.3 F (36.3 C) (Oral)   Resp 16   LMP 03/12/2017 (Exact Date)   SpO2 99%   Visual Acuity Right Eye Distance:   Left Eye Distance:   Bilateral Distance:    Right Eye Near:   Left Eye Near:    Bilateral Near:     Physical Exam  Constitutional: She appears well-developed and well-nourished.  HENT:  Head: Normocephalic.  Right Ear: External ear normal.  Left Ear: External ear normal.  Nose: Nose normal.  Mouth/Throat: Oropharynx is clear and moist.  Cardiovascular: Normal rate.  Pulmonary/Chest: Effort normal.  Musculoskeletal: Normal range of motion.  Neurological: She is alert.  Skin: Skin is warm.  Psychiatric: She has a normal mood and affect.  Nursing note reviewed.    UC Treatments / Results  Labs (all labs ordered are listed, but only abnormal results are displayed) Labs  Reviewed - No data to display  EKG  EKG Interpretation None       Radiology No results found.  Procedures Procedures (including critical care time)  Medications Ordered in UC Medications - No data to display   Initial Impression / Assessment and Plan / UC Course  I have reviewed the triage vital signs and the nursing notes.  Pertinent labs & imaging results that were available during my care of the patient were reviewed by me and considered in my medical decision making (see chart for details).     Pt states she is feeling better than yesterday.  Final Clinical Impressions(s) / UC Diagnoses   Final diagnoses:  None    ED Discharge Orders    None       Controlled Substance Prescriptions [place] Controlled Substance Registry consulted? No   Elson AreasSofia, Leslie K, [place]PA-C 04/07/17 [id number]

## 2017-04-07 NOTE — ED Triage Notes (Addendum)
[facility] since [facility]

## 2017-05-04 ENCOUNTER — Ambulatory Visit (HOSPITAL_COMMUNITY)
Admission: EM | Admit: 2017-05-04 | Discharge: 2017-05-04 | Disposition: A | Discharge status: Home / Self Care | Payer: Self-pay | Attending: Family Medicine | Admitting: Family Medicine

## 2017-05-04 ENCOUNTER — Encounter (HOSPITAL_COMMUNITY): Payer: Self-pay | Admitting: Family Medicine

## 2017-05-04 DIAGNOSIS — K0889 Other specified disorders of teeth and supporting structures: Secondary | ICD-10-CM

## 2017-05-04 MED ORDER — BACITRACIN ZINC 500 UNIT/GM EX OINT
TOPICAL_OINTMENT | CUTANEOUS | Status: AC
  Filled 2017-05-04: qty 9

## 2017-05-04 MED ORDER — HYDROCODONE-ACETAMINOPHEN 5-325 MG PO TABS: 1.0000 | ORAL_TABLET | Freq: Four times a day (QID) | ORAL | 0 refills | Status: DC | PRN

## 2017-05-04 MED ORDER — AMOXICILLIN-POT CLAVULANATE 875-125 MG PO TABS: 1.0000 | ORAL_TABLET | Freq: Two times a day (BID) | ORAL | 0 refills | Status: DC

## 2017-05-04 NOTE — Discharge Instructions (Signed)
Be aware, pain medications may cause drowsiness. Please do not drive, operate heavy machinery or make important decisions while on this medication, it can cloud your judgement.  

## 2017-05-04 NOTE — ED Triage Notes (Signed)
Pt here for pain to left upper mouth from broken tooth. sts for a while

## 2017-05-06 NOTE — ED Provider Notes (Signed)
  [facility]   [id number] 05/04/17 Arrival Time: 1629  ASSESSMENT & PLAN:  1. Pain, dental     Meds ordered this encounter  Medications  . HYDROcodone-acetaminophen (NORCO/VICODIN) 5-325 MG tablet    Sig: Take 1 tablet by mouth every 6 (six) hours as needed for moderate pain or severe pain.    Dispense:  4 tablet    Refill:  0  . amoxicillin-clavulanate (AUGMENTIN) 875-125 MG tablet    Sig: Take 1 tablet by mouth every 12 (twelve) hours.    Dispense:  14 tablet    Refill:  0   [place] Controlled Substances Registry consulted for this patient. I feel the risk/benefit ratio today is favorable for proceeding with this prescription for a controlled substance. Medication sedation precautions given.  Dental resource written instructions given. She will schedule dental evaluation as soon as possible.  Reviewed expectations re: course of current medical issues. Questions answered. Outlined signs and symptoms indicating need for more acute intervention. Patient verbalized understanding. After Visit Summary given.   SUBJECTIVE:  Ashley Morris is a 21 y.o. female who reports gradual onset of left upper dental pain where she has a broken tooth (tooth broken "a long time ago"). Present for approx 5-6 days. Afebrile. Tolerating PO intake but reports pain with chewing. She does not see a dentist regularly.  ROS: As per HPI.  OBJECTIVE:  Vitals:   05/04/17 1646  BP: (!) 128/96  Pulse: 83  Resp: 18  Temp: 98.3 F (36.8 C)  SpO2: 99%    General appearance: alert; no distress HENT: normocephalic; atraumatic; dentition: fair; gingival hypertrophy near broken molar on L lower side; gums are tender here; no fluctuance Neck: supple without LAD Lungs: normal respirations Skin: warm and dry Psychological: alert and cooperative; normal mood and affect  No Known Allergies  Past Medical History:  Diagnosis Date  . Asthma   . Urinary tract infection    Social History    Socioeconomic History  . Marital status: Single    Spouse name: Not on file  . Number of children: Not on file  . Years of education: Not on file  . Highest education level: Not on file  Social Needs  . Financial resource strain: Not on file  . Food insecurity - worry: Not on file  . Food insecurity - inability: Not on file  . Transportation needs - medical: Not on file  . Transportation needs - non-medical: Not on file  Occupational History  . Not on file  Tobacco Use  . Smoking status: Current Every Day Smoker    Types: Cigarettes  . Smokeless tobacco: Never Used  Substance and Sexual Activity  . Alcohol use: Yes  . Drug use: Yes    Types: Marijuana  . Sexual activity: Yes    Birth control/protection: None    Comment: threee days ago  Other Topics Concern  . Not on file  Social History Narrative  . Not on file   Family History  Problem Relation Age of Onset  . Hypertension Mother   . Anemia Mother    Past Surgical History:  Procedure Laterality Date  . NO PAST SURGERIES       Mardella LaymanHagler, Patirica Longshore, MD 05/06/17 1435

## 2017-11-29 ENCOUNTER — Emergency Department (HOSPITAL_COMMUNITY): Payer: Self-pay

## 2017-11-29 ENCOUNTER — Other Ambulatory Visit: Payer: Self-pay

## 2017-11-29 ENCOUNTER — Encounter (HOSPITAL_COMMUNITY): Payer: Self-pay

## 2017-11-29 ENCOUNTER — Emergency Department (HOSPITAL_COMMUNITY)
Admission: EM | Admit: 2017-11-29 | Discharge: 2017-11-29 | Disposition: A | Discharge status: Home / Self Care | Payer: Self-pay | Attending: Emergency Medicine | Admitting: Emergency Medicine

## 2017-11-29 DIAGNOSIS — J45909 Unspecified asthma, uncomplicated: Secondary | ICD-10-CM | POA: Insufficient documentation

## 2017-11-29 DIAGNOSIS — Z79899 Other long term (current) drug therapy: Secondary | ICD-10-CM | POA: Insufficient documentation

## 2017-11-29 DIAGNOSIS — R0602 Shortness of breath: Secondary | ICD-10-CM | POA: Insufficient documentation

## 2017-11-29 DIAGNOSIS — F17210 Nicotine dependence, cigarettes, uncomplicated: Secondary | ICD-10-CM | POA: Insufficient documentation

## 2017-11-29 DIAGNOSIS — R0789 Other chest pain: Secondary | ICD-10-CM | POA: Insufficient documentation

## 2017-11-29 LAB — I-STAT BETA HCG BLOOD, ED (MC, WL, AP ONLY): I-stat hCG, quantitative: <5 m[IU]/mL (ref <5)

## 2017-11-29 LAB — I-STAT TROPONIN, ED: Troponin i, poc: 0 ng/mL (ref 0.00–0.08)

## 2017-11-29 LAB — CBC
HEMATOCRIT: 42 % (ref 36.0–46.0)
HEMOGLOBIN: 13.9 g/dL (ref 12.0–15.0)
MCH: 31 pg (ref 26.0–34.0)
MCHC: 33.1 g/dL (ref 30.0–36.0)
MCV: 93.8 fL (ref 78.0–100.0)
Platelets: 264 10*3/uL (ref 150–400)
RBC: 4.48 MIL/uL (ref 3.87–5.11)
RDW: 13.2 % (ref 11.5–15.5)
WBC: 10 10*3/uL (ref 4.0–10.5)

## 2017-11-29 LAB — BASIC METABOLIC PANEL
ANION GAP: 11 (ref 5–15)
BUN: 10 mg/dL (ref 6–20)
CHLORIDE: 108 mmol/L (ref 98–111)
CO2: 21 mmol/L — AB (ref 22–32)
Calcium: 9 mg/dL (ref 8.9–10.3)
Creatinine, Ser: 0.88 mg/dL (ref 0.44–1.00)
GFR calc non Af Amer: >60 mL/min (ref >60)
GLUCOSE: 84 mg/dL (ref 70–99)
Potassium: 3.4 mmol/L — ABNORMAL LOW (ref 3.5–5.1)
Sodium: 140 mmol/L (ref 135–145)

## 2017-11-29 NOTE — ED Triage Notes (Signed)
Pt arrived via [facility] from home after reporting sudden onset of CP today with some nausea and SOB. 324 ASA given by fire, 1 [facility]

## 2017-11-29 NOTE — Discharge Instructions (Signed)
You have musculoskeletal pain of your chest wall.  There are no abnormalities of your heart enzymes or heart electrical activity, there are no signs of pneumonia or other lung infection.  It would be the best thing you can do for your health to discuss continue smoking!  It is okay to use ibuprofen or Aleve for the short-term over the next few days or a week while this is improving, you can also use Tylenol.  Please take ibuprofen with food to help prevent stomach irritation.  Return if you have worsening chest pain or any other symptoms.

## 2017-11-29 NOTE — ED Notes (Signed)
ED Provider at bedside. 

## 2017-11-29 NOTE — ED Provider Notes (Signed)
[facility] Provider Note   CSN: [id number] Arrival date & time: 11/29/17  1720     History   Chief Complaint Chief Complaint  Patient presents with  . Chest Pain  . Shortness of Breath    HPI Ashley Songsterieisha Alper is a 22 y.o. female.  HPI  Patient presents today with sudden onset of right-sided chest pain while she was standing in her driveway.  Never had anything like this before.  No chronic medications.  Denies any chronic medical problems other than tobacco abuse.  States she is smokes 1/2 pack/day of cigarettes in addition to "lots" of marijuana.  She states the pain moved on her right arm.  She describes it as sharp in nature.  She feels podiatry may have helped a little bit when it was given to the EMS.  She states lying down helps her symptoms as well.  Birth control wise she is has a Nexplanon in place.  Denies history of clots, denies history of clots in her family as well.  States there was some pain in her right breast feels well.  Denies any new piercings or recent weaning.  No history of prolonged travel or hospital admission recently. Past Medical History:  Diagnosis Date  . Asthma   . Urinary tract infection     Patient Active Problem List   Diagnosis Date Noted  . Active labor at term 11/29/2014  . Abdominal pain in pregnancy, antepartum   . Non-reactive NST (non-stress test)   . Noncompliant pregnant patient in third trimester, antepartum 10/07/2014  . Decreased fetal movement affecting management of mother, antepartum 10/07/2014  . Supervision of normal pregnancy in second trimester 06/17/2014  . 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 NO PAST SURGERIES       OB History    Gravida  3   Para  2   Term  2   Preterm      AB  1   Living  2     SAB  1   TAB      Ectopic      Multiple  0   Live Births  2            Home Medications    Prior to Admission  medications   Medication Sig Start Date End Date Taking? Authorizing Provider  acetaminophen-codeine (TYLENOL #3) 300-30 MG tablet Take 1 tablet by mouth every 6 (six) hours as needed for moderate pain. 01/27/17   Derwood KaplanNanavati, Ankit, MD  amoxicillin-clavulanate (AUGMENTIN) 875-125 MG tablet Take 1 tablet by mouth every 12 (twelve) hours. 05/04/17   Mardella LaymanHagler, Brian, MD  benzonatate (TESSALON) 100 MG capsule Take 1 capsule (100 mg total) by mouth every 8 (eight) hours. 01/27/17   Derwood KaplanNanavati, Ankit, MD  HYDROcodone-acetaminophen (NORCO/VICODIN) 5-325 MG tablet Take 1 tablet by mouth every 6 (six) hours as needed for moderate pain or severe pain. 05/04/17   Mardella LaymanHagler, Brian, MD    Family History Family History  Problem Relation Age of Onset  . Hypertension Mother   . Anemia Mother     Social History Social History   Tobacco Use  . Smoking status: Current Every Day Smoker    Types: Cigarettes  . Smokeless tobacco: Never Used  Substance Use Topics  . Alcohol use: Yes  . Drug use: Yes    Types: Marijuana     Allergies   Patient has no known allergies.  Review of Systems Review of Systems  Constitutional: Negative for activity change and fever.  HENT: Negative for sore throat.   Respiratory: Positive for shortness of breath. Negative for cough, chest tightness and wheezing.   Cardiovascular: Positive for chest pain. Negative for palpitations and leg swelling.  Gastrointestinal: Negative for abdominal pain.  Skin: Negative for rash and wound.  All other systems reviewed and are negative.    Physical Exam Updated Vital Signs BP (!) 109/59   Pulse 68   Temp 98.5 F (36.9 C) (Oral)   Resp (!) 21   Ht 5\' 7"  (1.702 m)   Wt 80.7 kg (178 lb)   LMP 10/17/2017   SpO2 100%   BMI 27.88 kg/m   Physical Exam  Constitutional: She is oriented to person, place, and time. She appears well-developed and well-nourished. She does not appear ill. No distress.  HENT:  Head: Normocephalic and  atraumatic.  Neck: Normal range of motion.  Cardiovascular: Normal rate and regular rhythm.  No murmur heard. Pulmonary/Chest: Effort normal and breath sounds normal.  Abdominal: Soft. Bowel sounds are normal.  Musculoskeletal:       Right lower leg: Normal. She exhibits no edema.       Left lower leg: Normal. She exhibits no edema.  Significant tenderness to palpation over right sternoclavicular joint.  No overlying rash or wound.  Neurological: She is alert and oriented to person, place, and time.  Skin: Skin is warm and dry. Capillary refill takes less than 2 seconds.  Psychiatric: Her behavior is normal.     ED Treatments / Results  Labs (all labs ordered are listed, but only abnormal results are displayed) Labs Reviewed  BASIC METABOLIC PANEL - Abnormal; Notable for the following components:      Result Value   Potassium 3.4 (*)    CO2 21 (*)    All other components within normal limits  CBC  I-STAT TROPONIN, ED  I-STAT BETA HCG BLOOD, ED ([facility], [facility], [facility] ONLY)    EKG EKG Interpretation  Date/Time:  Friday November 29 2017 17:23:52 EDT Ventricular Rate:  72 PR Interval:  162 QRS Duration: 88 QT Interval:  404 QTC Calculation: 442 R Axis:   86 Text Interpretation:  Normal sinus rhythm Normal ECG Confirmed by Blane Ohara [phone]) on 11/29/2017 7:15:40 PM   Radiology Dg Chest 2 View  Result Date: 11/29/2017 CLINICAL DATA:  Chest pain and dyspnea today. EXAM: CHEST - 2 VIEW COMPARISON:  01/27/2017 FINDINGS: The heart size and mediastinal contours are within normal limits. Both lungs are clear. The visualized skeletal structures are unremarkable. IMPRESSION: No active cardiopulmonary disease. Electronically Signed   By: Tollie Eth M.D.   On: 11/29/2017 18:44    Procedures Procedures (including critical care time)  Medications Ordered in ED Medications - No data to display   Initial Impression / Assessment and Plan / ED Course  I have reviewed the triage vital signs  and the nursing notes.  Pertinent labs & imaging results that were available during my care of the patient were reviewed by me and considered in my medical decision making (see chart for details).    Patient with chest wall pain reproducible on exam.  EKG normal, troponin negative, labs normal.  Normal lung exam and normal chest x-ray making infectious etiology less likely.  Patient without history suggestive of PE.  Normal heart rate as well.  Low risk for such.  Will discharge with supportive care for chest wall pain, return if  worsening or new concerns.   Final Clinical Impressions(s) / ED Diagnoses   Final diagnoses:  Chest wall pain    ED Discharge Orders    None       Garth Bigness, MD 11/29/17 2108    Blane Ohara, MD 11/29/17 2352

## 2017-11-29 NOTE — ED Provider Notes (Signed)
MSE was initiated and I personally evaluated the patient and placed orders (if any) at  5:32 PM on November 29, 2017.  The patient appears stable so that the remainder of the MSE may be completed by another provider.  Patient placed in Quick Look pathway, seen and evaluated   Chief Complaint: chest pain  HPI:   Patient presents to ED for evaluation of sudden onset central chest pain that began 1 hour prior to arrival.  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  Was not doing anything exertional.  Has not taken any medicine help with the pain.  No improvement with nitroglycerin and aspirin given by EMS.  States the nitroglycerin has given her a headache.  Reports associated nausea.  Denies any shortness of breath, abdominal pain, vomiting, hemoptysis, recent surgeries, recent prolonged travel, OCP use, prior PE or MI.  ROS: chest pain  Physical Exam:   Gen: No distress  Neuro: Awake and Alert  Skin: Warm    Focused Exam: Midsternal chest tenderness to palpation. Lungs CTAB. RRR.    Initiation of care has begun. The patient has been counseled on the process, plan, and necessity for staying for the completion/evaluation, and the remainder of the medical screening examination    Dietrich PatesKhatri, Meagen Limones, PA-C 11/29/17 1733    Gerhard MunchLockwood, Robert, MD 11/29/17 2250

## 2018-09-14 ENCOUNTER — Other Ambulatory Visit: Payer: Self-pay

## 2018-09-14 ENCOUNTER — Emergency Department (HOSPITAL_COMMUNITY)
Admission: EM | Admit: 2018-09-14 | Discharge: 2018-09-14 | Disposition: A | Discharge status: Home / Self Care | Payer: Self-pay | Attending: Emergency Medicine | Admitting: Emergency Medicine

## 2018-09-14 ENCOUNTER — Encounter (HOSPITAL_COMMUNITY): Payer: Self-pay | Admitting: Emergency Medicine

## 2018-09-14 DIAGNOSIS — J45909 Unspecified asthma, uncomplicated: Secondary | ICD-10-CM | POA: Insufficient documentation

## 2018-09-14 DIAGNOSIS — F17210 Nicotine dependence, cigarettes, uncomplicated: Secondary | ICD-10-CM | POA: Insufficient documentation

## 2018-09-14 DIAGNOSIS — K529 Noninfective gastroenteritis and colitis, unspecified: Secondary | ICD-10-CM

## 2018-09-14 DIAGNOSIS — A084 Viral intestinal infection, unspecified: Secondary | ICD-10-CM | POA: Insufficient documentation

## 2018-09-14 LAB — COMPREHENSIVE METABOLIC PANEL
ALT: 14 U/L (ref 0–44)
AST: 18 U/L (ref 15–41)
Albumin: 4.1 g/dL (ref 3.5–5.0)
Alkaline Phosphatase: 83 U/L (ref 38–126)
Anion gap: 10 (ref 5–15)
BUN: 11 mg/dL (ref 6–20)
CO2: 21 mmol/L — ABNORMAL LOW (ref 22–32)
Calcium: 8.6 mg/dL — ABNORMAL LOW (ref 8.9–10.3)
Chloride: 103 mmol/L (ref 98–111)
Creatinine, Ser: 0.84 mg/dL (ref 0.44–1.00)
GFR calc Af Amer: >60 mL/min (ref >60)
GFR calc non Af Amer: >60 mL/min (ref >60)
Glucose, Bld: 99 mg/dL (ref 70–99)
Potassium: 3.3 mmol/L — ABNORMAL LOW (ref 3.5–5.1)
Sodium: 134 mmol/L — ABNORMAL LOW (ref 135–145)
Total Bilirubin: 0.9 mg/dL (ref 0.3–1.2)
Total Protein: 8 g/dL (ref 6.5–8.1)

## 2018-09-14 LAB — URINALYSIS, ROUTINE W REFLEX MICROSCOPIC
Bacteria, UA: NONE SEEN
Bilirubin Urine: NEGATIVE
Glucose, UA: NEGATIVE mg/dL
Hgb urine dipstick: NEGATIVE
Ketones, ur: 20 mg/dL — AB
Nitrite: NEGATIVE
Protein, ur: NEGATIVE mg/dL
Specific Gravity, Urine: 1.03 (ref 1.005–1.030)
pH: 6 (ref 5.0–8.0)

## 2018-09-14 LAB — LIPASE, BLOOD: Lipase: 22 U/L (ref 11–51)

## 2018-09-14 LAB — I-STAT BETA HCG BLOOD, ED (MC, WL, AP ONLY): I-stat hCG, quantitative: <5 m[IU]/mL (ref <5)

## 2018-09-14 LAB — LACTIC ACID, PLASMA: Lactic Acid, Venous: 0.7 mmol/L (ref 0.5–1.9)

## 2018-09-14 MED ORDER — SODIUM CHLORIDE 0.9 % IV BOLUS
1000.0000 mL | Freq: Once | INTRAVENOUS | Status: AC
  Administered 2018-09-14: 1000 mL via INTRAVENOUS

## 2018-09-14 MED ORDER — ONDANSETRON HCL 4 MG/2ML IJ SOLN
4.0000 mg | Freq: Once | INTRAMUSCULAR | Status: AC
  Administered 2018-09-14: 4 mg via INTRAVENOUS
  Filled 2018-09-14: qty 2

## 2018-09-14 MED ORDER — ONDANSETRON 8 MG PO TBDP: ORAL_TABLET | ORAL | 0 refills | Status: DC

## 2018-09-14 MED ORDER — KETOROLAC TROMETHAMINE 30 MG/ML IJ SOLN
30.0000 mg | Freq: Once | INTRAMUSCULAR | Status: AC
Start: 2018-09-14 — End: 2018-09-14
  Administered 2018-09-14: 30 mg via INTRAVENOUS
  Filled 2018-09-14: qty 1

## 2018-09-14 NOTE — ED Triage Notes (Signed)
Patient arrived by EMS from motel. Pt c/o N/V for a couple months.   Pt denies hx of abdominal hx per EMS.   EMS VS BP 122/80, HR 80, SpO2 98%.

## 2018-09-14 NOTE — ED Notes (Signed)
Bed: WA10 Expected date: 09/14/18 Expected time: 9:01 AM Means of arrival: Ambulance Comments: 23 yo F N/V

## 2018-09-14 NOTE — ED Notes (Signed)
Patient given discharge teaching and verbalized understanding. Patient ambulated out of ED with a steady gait. 

## 2018-09-14 NOTE — Discharge Instructions (Addendum)
Zofran as prescribed as needed for nausea.  Clear liquid diet for the next 12 hours, then advance to normal as tolerated.  Return to the emergency department if you develop severe abdominal pain, high fevers, bloody stools, or other new and concerning symptoms.

## 2018-09-14 NOTE — ED Provider Notes (Signed)
[facility]-EMERGENCY DEPT Provider Note   CSN: [id number] Arrival date & time: 09/14/18  [id number]    History   Chief Complaint Chief Complaint  Patient presents with  . Abdominal Pain    HPI Ashley Songsterieisha Busser is a 23 y.o. female.     Patient is a 23 year old female with history of asthma and prior childbirth.  She presents today for evaluation of nausea, vomiting, and diarrhea that started 2 days ago.  She states that she has been able to keep little food and liquid down.  All has been nonbloody and nonbilious.  She denies any fevers or chills.  She denies any ill contacts.  The history is provided by the patient.  Abdominal Pain  Pain location:  Generalized Pain quality: cramping   Pain radiates to:  Does not radiate Pain severity:  Moderate Onset quality:  Sudden Duration:  2 days Timing:  Constant Progression:  Worsening Chronicity:  New Relieved by:  Nothing Worsened by:  Nothing Ineffective treatments:  None tried Associated symptoms: diarrhea, nausea and vomiting   Associated symptoms: no fever, no hematemesis and no hematochezia     Past Medical History:  Diagnosis Date  . Asthma   . Urinary tract infection     Patient Active Problem List   Diagnosis Date Noted  . Active labor at term 11/29/2014  . Abdominal pain in pregnancy, antepartum   . Non-reactive NST (non-stress test)   . Noncompliant pregnant patient in third trimester, antepartum 10/07/2014  . Decreased fetal movement affecting management of mother, antepartum 10/07/2014  . Supervision of normal pregnancy in second trimester 06/17/2014  . 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 NO PAST SURGERIES       OB History    Gravida  3   Para  2   Term  2   Preterm      AB  1   Living  2     SAB  1   TAB      Ectopic      Multiple  0   Live Births  2            Home Medications    Prior to Admission medications    Medication Sig Start Date End Date Taking? Authorizing Provider  acetaminophen-codeine (TYLENOL #3) 300-30 MG tablet Take 1 tablet by mouth every 6 (six) hours as needed for moderate pain. 01/27/17   Derwood KaplanNanavati, Ankit, MD  amoxicillin-clavulanate (AUGMENTIN) 875-125 MG tablet Take 1 tablet by mouth every 12 (twelve) hours. 05/04/17   Mardella LaymanHagler, Brian, MD  benzonatate (TESSALON) 100 MG capsule Take 1 capsule (100 mg total) by mouth every 8 (eight) hours. 01/27/17   Derwood KaplanNanavati, Ankit, MD  HYDROcodone-acetaminophen (NORCO/VICODIN) 5-325 MG tablet Take 1 tablet by mouth every 6 (six) hours as needed for moderate pain or severe pain. 05/04/17   Mardella LaymanHagler, Brian, MD    Family History Family History  Problem Relation Age of Onset  . Hypertension Mother   . Anemia Mother     Social History Social History   Tobacco Use  . Smoking status: Current Every Day Smoker    Types: Cigarettes  . Smokeless tobacco: Never Used  Substance Use Topics  . Alcohol use: Yes  . Drug use: Yes    Types: Marijuana     Allergies   Patient has no known allergies.   Review of Systems Review of Systems  Constitutional: Negative for fever.  Gastrointestinal:  Positive for abdominal pain, diarrhea, nausea and vomiting. Negative for hematemesis and hematochezia.  All other systems reviewed and are negative.    Physical Exam Updated Vital Signs BP 124/76 (BP Location: Left Arm)   Pulse 82   Temp 97.6 F (36.4 C) (Oral)   Resp 18   Ht 5\' 7"  (1.702 m)   Wt 72.6 kg   LMP 08/14/2018   SpO2 100%   BMI 25.06 kg/m   Physical Exam Vitals signs and nursing note reviewed.  Constitutional:      General: She is not in acute distress.    Appearance: She is well-developed. She is not diaphoretic.  HENT:     Head: Normocephalic and atraumatic.  Neck:     Musculoskeletal: Normal range of motion and neck supple.  Cardiovascular:     Rate and Rhythm: Normal rate and regular rhythm.     Heart sounds: No murmur. No friction  rub. No gallop.   Pulmonary:     Effort: Pulmonary effort is normal. No respiratory distress.     Breath sounds: Normal breath sounds. No wheezing.  Abdominal:     General: Bowel sounds are normal. There is no distension.     Palpations: Abdomen is soft.     Tenderness: There is generalized abdominal tenderness. There is no right CVA tenderness, left CVA tenderness, guarding or rebound.  Musculoskeletal: Normal range of motion.  Skin:    General: Skin is warm and dry.  Neurological:     Mental Status: She is alert and oriented to person, place, and time.      ED Treatments / Results  Labs (all labs ordered are listed, but only abnormal results are displayed) Labs Reviewed  COMPREHENSIVE METABOLIC PANEL  LIPASE, BLOOD  LACTIC ACID, PLASMA  LACTIC ACID, PLASMA  URINALYSIS, ROUTINE W REFLEX MICROSCOPIC  I-STAT BETA HCG BLOOD, ED ([facility], [facility], [facility] ONLY)    EKG None  Radiology No results found.  Procedures Procedures (including critical care time)  Medications Ordered in ED Medications  ketorolac (TORADOL) 30 MG/ML injection 30 mg (has no administration in time range)  ondansetron (ZOFRAN) injection 4 mg (has no administration in time range)  sodium chloride 0.9 % bolus 1,000 mL (has no administration in time range)     Initial Impression / Assessment and Plan / ED Course  I have reviewed the triage vital signs and the nursing notes.  Pertinent labs & imaging results that were available during my care of the patient were reviewed by me and considered in my medical decision making (see chart for details).  Patient presents here with complaints of nausea, vomiting, and diarrhea that started 2 days ago.  She states she has been and able to keep much liquid or solid nourishment down.  She was given a liter of normal saline along with Toradol and Zofran and appears to be feeling much better.  Her electrolytes are unremarkable, urine shows a hint of a UTI, however the patient is  having no symptoms.  At this point, I feels that the patient is appropriate for discharge.  She will be given Zofran and clear liquids.  To return as needed if symptoms worsen or change.  Final Clinical Impressions(s) / ED Diagnoses   Final diagnoses:  None    ED Discharge Orders    None       Geoffery Lyons, MD 09/14/18 1139

## 2019-09-19 ENCOUNTER — Emergency Department (HOSPITAL_COMMUNITY): Payer: Self-pay

## 2019-09-19 ENCOUNTER — Emergency Department (HOSPITAL_COMMUNITY)
Admission: EM | Admit: 2019-09-19 | Discharge: 2019-09-19 | Disposition: A | Discharge status: Home / Self Care | Payer: Self-pay | Attending: Emergency Medicine | Admitting: Emergency Medicine

## 2019-09-19 ENCOUNTER — Encounter (HOSPITAL_COMMUNITY): Payer: Self-pay | Admitting: Emergency Medicine

## 2019-09-19 DIAGNOSIS — F1210 Cannabis abuse, uncomplicated: Secondary | ICD-10-CM | POA: Insufficient documentation

## 2019-09-19 DIAGNOSIS — E860 Dehydration: Secondary | ICD-10-CM | POA: Insufficient documentation

## 2019-09-19 DIAGNOSIS — R197 Diarrhea, unspecified: Secondary | ICD-10-CM | POA: Insufficient documentation

## 2019-09-19 DIAGNOSIS — F17210 Nicotine dependence, cigarettes, uncomplicated: Secondary | ICD-10-CM | POA: Insufficient documentation

## 2019-09-19 DIAGNOSIS — J45909 Unspecified asthma, uncomplicated: Secondary | ICD-10-CM | POA: Insufficient documentation

## 2019-09-19 DIAGNOSIS — R110 Nausea: Secondary | ICD-10-CM | POA: Insufficient documentation

## 2019-09-19 LAB — CBC WITH DIFFERENTIAL/PLATELET
Abs Immature Granulocytes: 0.04 10*3/uL (ref 0.00–0.07)
Basophils Absolute: 0 10*3/uL (ref 0.0–0.1)
Basophils Relative: 0 %
Eosinophils Absolute: 0 10*3/uL (ref 0.0–0.5)
Eosinophils Relative: 0 %
HCT: 40.8 % (ref 36.0–46.0)
Hemoglobin: 13.4 g/dL (ref 12.0–15.0)
Immature Granulocytes: 1 %
Lymphocytes Relative: 47 %
Lymphs Abs: 1.9 10*3/uL (ref 0.7–4.0)
MCH: 29.5 pg (ref 26.0–34.0)
MCHC: 32.8 g/dL (ref 30.0–36.0)
MCV: 89.7 fL (ref 80.0–100.0)
Monocytes Absolute: 0.4 10*3/uL (ref 0.1–1.0)
Monocytes Relative: 11 %
Neutro Abs: 1.6 10*3/uL — ABNORMAL LOW (ref 1.7–7.7)
Neutrophils Relative %: 41 %
Platelets: 217 10*3/uL (ref 150–400)
RBC: 4.55 MIL/uL (ref 3.87–5.11)
RDW: 13.2 % (ref 11.5–15.5)
WBC: 3.9 10*3/uL — ABNORMAL LOW (ref 4.0–10.5)
nRBC: 0 % (ref 0.0–0.2)

## 2019-09-19 LAB — HEPATIC FUNCTION PANEL
ALT: 38 U/L (ref 0–44)
AST: 58 U/L — ABNORMAL HIGH (ref 15–41)
Albumin: 3.8 g/dL (ref 3.5–5.0)
Alkaline Phosphatase: 106 U/L (ref 38–126)
Bilirubin, Direct: 0.2 mg/dL (ref 0.0–0.2)
Indirect Bilirubin: 0.5 mg/dL (ref 0.3–0.9)
Total Bilirubin: 0.7 mg/dL (ref 0.3–1.2)
Total Protein: 8.2 g/dL — ABNORMAL HIGH (ref 6.5–8.1)

## 2019-09-19 LAB — BASIC METABOLIC PANEL
Anion gap: 10 (ref 5–15)
BUN: 8 mg/dL (ref 6–20)
CO2: 24 mmol/L (ref 22–32)
Calcium: 8.6 mg/dL — ABNORMAL LOW (ref 8.9–10.3)
Chloride: 102 mmol/L (ref 98–111)
Creatinine, Ser: 0.66 mg/dL (ref 0.44–1.00)
GFR calc Af Amer: >60 mL/min (ref >60)
GFR calc non Af Amer: >60 mL/min (ref >60)
Glucose, Bld: 93 mg/dL (ref 70–99)
Potassium: 3.3 mmol/L — ABNORMAL LOW (ref 3.5–5.1)
Sodium: 136 mmol/L (ref 135–145)

## 2019-09-19 LAB — URINALYSIS, ROUTINE W REFLEX MICROSCOPIC
Bacteria, UA: NONE SEEN
Glucose, UA: NEGATIVE mg/dL
Hgb urine dipstick: NEGATIVE
Ketones, ur: 80 mg/dL — AB
Nitrite: NEGATIVE
Protein, ur: 100 mg/dL — AB
Specific Gravity, Urine: 1.032 — ABNORMAL HIGH (ref 1.005–1.030)
pH: 6 (ref 5.0–8.0)

## 2019-09-19 LAB — I-STAT BETA HCG BLOOD, ED (MC, WL, AP ONLY): I-stat hCG, quantitative: <5 m[IU]/mL (ref <5)

## 2019-09-19 MED ORDER — SODIUM CHLORIDE 0.9 % IV BOLUS
1000.0000 mL | Freq: Once | INTRAVENOUS | Status: AC
  Administered 2019-09-19: 17:00:00 1000 mL via INTRAVENOUS

## 2019-09-19 MED ORDER — ONDANSETRON HCL 4 MG/2ML IJ SOLN
4.0000 mg | Freq: Once | INTRAMUSCULAR | Status: AC
  Administered 2019-09-19: 4 mg via INTRAVENOUS
  Filled 2019-09-19: qty 2

## 2019-09-19 MED ORDER — ONDANSETRON HCL 4 MG PO TABS: 4.0000 mg | ORAL_TABLET | Freq: Four times a day (QID) | ORAL | 0 refills | Status: AC

## 2019-09-19 NOTE — ED Triage Notes (Signed)
Per [facility] pt from work for fatigue and hypotension.

## 2019-09-19 NOTE — ED Triage Notes (Signed)
Pt states that she is feeling bad, weak, nausea and back pain that hurts when takes deep breath. reports working on same ginger ale 2 liter bottle for 5 days. Reports her birth control implant in right arm is expired.

## 2019-09-19 NOTE — ED Provider Notes (Signed)
[facility]-EMERGENCY DEPT Provider Note   CSN: [id number] Arrival date & time: 09/19/19  1214     History Chief Complaint  Patient presents with  . Fatigue    Ashley Morris is a 24 y.o. female.  The history is provided by the patient.  Illness Location:  General Severity:  Mild Onset quality:  Gradual Timing:  Constant Progression:  Unchanged Chronicity:  New Context:  General fatigue and aches and pains. Poor fluid intake, diarrhea, nausea. No chest pain or abdominal pain. Concerned for dehydration. Relieved by:  Nothing Worsened by:  Nothing Associated symptoms: diarrhea, fatigue and nausea   Associated symptoms: no abdominal pain, no chest pain, no cough, no ear pain, no fever, no rash, no shortness of breath, no sore throat and no vomiting        Past Medical History:  Diagnosis Date  . Asthma   . Urinary tract infection     Patient Active Problem List   Diagnosis Date Noted  . Active labor at term 11/29/2014  . Abdominal pain in pregnancy, antepartum   . Non-reactive NST (non-stress test)   . Noncompliant pregnant patient in third trimester, antepartum 10/07/2014  . Decreased fetal movement affecting management of mother, antepartum 10/07/2014  . Supervision of normal pregnancy in second trimester 06/17/2014  . 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 NO PAST SURGERIES       OB History    Gravida  3   Para  2   Term  2   Preterm      AB  1   Living  2     SAB  1   TAB      Ectopic      Multiple  0   Live Births  2           Family History  Problem Relation Age of Onset  . Hypertension Mother   . Anemia Mother     Social History   Tobacco Use  . Smoking status: Current Every Day Smoker    Types: Cigarettes  . Smokeless tobacco: Never Used  Substance Use Topics  . Alcohol use: Yes  . Drug use: Yes    Types: Marijuana    Home Medications Prior to  Admission medications   Medication Sig Start Date End Date Taking? Authorizing Provider  ondansetron (ZOFRAN ODT) 8 MG disintegrating tablet 8mg  ODT q4 hours prn nausea 09/14/18   09/16/18, MD  ondansetron (ZOFRAN) 4 MG tablet Take 1 tablet (4 mg total) by mouth every 6 (six) hours for 10 doses. 09/19/19 09/22/19  11/22/19, DO    Allergies    Patient has no known allergies.  Review of Systems   Review of Systems  Constitutional: Positive for fatigue. Negative for chills and fever.  HENT: Negative for ear pain and sore throat.   Eyes: Negative for pain and visual disturbance.  Respiratory: Negative for cough and shortness of breath.   Cardiovascular: Negative for chest pain and palpitations.  Gastrointestinal: Positive for diarrhea and nausea. Negative for abdominal pain and vomiting.  Genitourinary: Negative for dysuria and hematuria.  Musculoskeletal: Negative for arthralgias and back pain.  Skin: Negative for color change and rash.  Neurological: Negative for seizures and syncope.  All other systems reviewed and are negative.   Physical Exam Updated Vital Signs  ED Triage Vitals [09/19/19 1219]  Enc Vitals Group     BP 103/67  Pulse Rate 81     Resp 18     Temp 98 F (36.7 C)     Temp Source Oral     SpO2 98 %     Weight      Height      Head Circumference      Peak Flow      Pain Score      Pain Loc      Pain Edu?      Excl. in GC?     Physical Exam Vitals and nursing note reviewed.  Constitutional:      General: She is not in acute distress.    Appearance: She is well-developed. She is not ill-appearing.  HENT:     Head: Normocephalic and atraumatic.     Nose: Nose normal.     Mouth/Throat:     Mouth: Mucous membranes are moist.  Eyes:     Extraocular Movements: Extraocular movements intact.     Conjunctiva/sclera: Conjunctivae normal.     Pupils: Pupils are equal, round, and reactive to light.  Cardiovascular:     Rate and Rhythm: Normal rate  and regular rhythm.     Pulses: Normal pulses.     Heart sounds: Normal heart sounds. No murmur.  Pulmonary:     Effort: Pulmonary effort is normal. No respiratory distress.     Breath sounds: Normal breath sounds.  Abdominal:     General: There is no distension.     Palpations: Abdomen is soft.     Tenderness: There is no abdominal tenderness.  Musculoskeletal:     Cervical back: Normal range of motion and neck supple.  Skin:    General: Skin is warm and dry.     Capillary Refill: Capillary refill takes less than 2 seconds.  Neurological:     General: No focal deficit present.     Mental Status: She is alert.     ED Results / Procedures / Treatments   Labs (all labs ordered are listed, but only abnormal results are displayed) Labs Reviewed  CBC WITH DIFFERENTIAL/PLATELET - Abnormal; Notable for the following components:      Result Value   WBC 3.9 (*)    All other components within normal limits  BASIC METABOLIC PANEL - Abnormal; Notable for the following components:   Potassium 3.3 (*)    Calcium 8.6 (*)    All other components within normal limits  HEPATIC FUNCTION PANEL - Abnormal; Notable for the following components:   Total Protein 8.2 (*)    AST 58 (*)    All other components within normal limits  URINALYSIS, ROUTINE W REFLEX MICROSCOPIC - Abnormal; Notable for the following components:   Color, Urine AMBER (*)    Specific Gravity, Urine 1.032 (*)    Bilirubin Urine SMALL (*)    Ketones, ur 80 (*)    Protein, ur 100 (*)    Leukocytes,Ua TRACE (*)    All other components within normal limits  I-STAT BETA HCG BLOOD, ED ([facility], [facility], [facility] ONLY)    EKG None  Radiology DG Chest Portable 1 View  Result Date: 09/19/2019 CLINICAL DATA:  Cough. Weakness. EXAM: PORTABLE CHEST 1 VIEW COMPARISON:  11/29/2017 FINDINGS: The cardiomediastinal contours are normal. The lungs are clear. Pulmonary vasculature is normal. No consolidation, pleural effusion, or pneumothorax. No acute  osseous abnormalities are seen. IMPRESSION: Negative portable AP view of the chest. Electronically Signed   By: Narda Rutherford M.D.   On: 09/19/2019 17:03  Procedures Procedures (including critical care time)  Medications Ordered in ED Medications  sodium chloride 0.9 % bolus 1,000 mL (1,000 mLs Intravenous New Bag/Given 09/19/19 1639)  ondansetron (ZOFRAN) injection 4 mg (4 mg Intravenous Given 09/19/19 1639)    ED Course  I have reviewed the triage vital signs and the nursing notes.  Pertinent labs & imaging results that were available during my care of the patient were reviewed by me and considered in my medical decision making (see chart for details).    MDM Rules/Calculators/A&P                      Fatiha Guzy is a 24 year old female who presents the ED with fatigue.  Patient with unremarkable vitals.  No fever.  Patient concerned for dehydration has poor p.o. intake over the last several days.  Has had some nausea and diarrhea.  Has had some night sweats.  Denies any fevers.  No exposure to coronavirus.  Denies any abdominal pain, chest pain.  Intermittent shortness of breath.  Patient overall appears well.  Will check for infection in the lungs, urine infection.  Will check basic labs to evaluate for dehydration and other electrolyte abnormalities.  Will give IV fluids, IV Zofran and check pregnancy test.  Lab work overall unremarkable.  Chest x-ray without signs of infection.  Mild ketones in the urine but no infection.  Likely mild dehydration.  However kidney function unremarkable.  Patient felt better after IV fluids.  Will prescribe Zofran.  Patient did not want to be checked for coronavirus.  Discharged in good condition.  This chart was dictated using voice recognition software.  Despite best efforts to proofread,  errors can occur which can change the documentation meaning.  Ashley Morris was evaluated in Emergency Department on 09/19/2019 for the symptoms described in  the history of present illness. She was evaluated in the context of the global COVID-19 pandemic, which necessitated consideration that the patient might be at risk for infection with the SARS-CoV-2 virus that causes COVID-19. Institutional protocols and algorithms that pertain to the evaluation of patients at risk for COVID-19 are in a state of rapid change based on information released by regulatory bodies including the CDC and federal and state organizations. These policies and algorithms were followed during the patient's care in the ED.    Final Clinical Impression(s) / ED Diagnoses Final diagnoses:  Dehydration    Rx / DC Orders ED Discharge Orders         Ordered    ondansetron (ZOFRAN) 4 MG tablet  Every 6 hours     09/19/19 1750           Lennice Sites, DO 09/19/19 1751

## 2019-09-25 ENCOUNTER — Ambulatory Visit: Payer: Self-pay | Attending: Internal Medicine | Admitting: Internal Medicine

## 2019-09-25 ENCOUNTER — Other Ambulatory Visit: Payer: Self-pay

## 2019-09-25 ENCOUNTER — Encounter: Payer: Self-pay | Admitting: Internal Medicine

## 2019-09-25 DIAGNOSIS — L02429 Furuncle of limb, unspecified: Secondary | ICD-10-CM

## 2019-09-25 DIAGNOSIS — Z975 Presence of (intrauterine) contraceptive device: Secondary | ICD-10-CM

## 2019-09-25 MED ORDER — SULFAMETHOXAZOLE-TRIMETHOPRIM 800-160 MG PO TABS: 1.0000 | ORAL_TABLET | Freq: Two times a day (BID) | ORAL | 0 refills | Status: DC

## 2019-09-25 NOTE — Progress Notes (Signed)
Pt states she has boils under her arm  Pt is wanting her nexplanon removed

## 2019-09-25 NOTE — Progress Notes (Signed)
Virtual Visit via Telephone Note Due to current restrictions/limitations of in-office visits due to the COVID-19 pandemic, this scheduled clinical appointment was converted to a telehealth visit  I connected with Ashley Morris on 09/25/19 at 2:20 p.m by telephone and verified that I am speaking with the correct person using two identifiers. I am in my office.  The patient is at home.  Only the patient and myself participated in this encounter.  I discussed the limitations, risks, security and privacy concerns of performing an evaluation and management service by telephone and the availability of in person appointments. I also discussed with the patient that there may be a patient responsible charge related to this service. The patient expressed understanding and agreed to proceed.   History of Present Illness: Pt wanting to est care with [place].   No recent PCP.  No chronic med conditions.  Pt wanting to have Nexplanon removed.  It has expired and she has some intermittent soreness at times at area of insertion. Not wanting any BC method for a a while after removal.  Over due for pap.   C/o having small boils in axilla x 2 wks.  Gets them intermittently - "they just go away and comes back." Not draining.  Happening since age 30.    She has not shaved the arm pits in past 30 days.  Does not share shavers.  Uses spray and rub on deodorants.    Observations/Objective: No direct observation done as this was a telephone encounter.  Assessment and Plan: 1. Family planning, Norplant in place We will have her schedule with Dr. Earlene Plater to have Nexplanon removed.  Then schedule with my nurse practitioner for Pap smear.  2. 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 Recommend good handwashing and avoid sharing changes. Will prescribe some Bactrim at this time for current lesions.  If any of them increase in size that she needs to be seen to have I&D   Follow Up Instructions: 6 wks for  PAP   I discussed the assessment and treatment plan with the patient. The patient was provided an opportunity to ask questions and all were answered. The patient agreed with the plan and demonstrated an understanding of the instructions.   The patient was advised to call back or seek an in-person evaluation if the symptoms worsen or if the condition fails to improve as anticipated.  I provided 10 minutes of non-face-to-face time during this encounter.   Jonah Blue, MD

## 2019-10-02 ENCOUNTER — Ambulatory Visit: Payer: Self-pay | Admitting: Family

## 2019-10-08 ENCOUNTER — Ambulatory Visit: Payer: Self-pay | Admitting: Family Medicine

## 2019-11-06 ENCOUNTER — Ambulatory Visit: Payer: Self-pay | Admitting: Internal Medicine

## 2019-11-13 ENCOUNTER — Telehealth: Payer: Self-pay

## 2019-11-13 NOTE — Telephone Encounter (Signed)
Called patient to do their pre-visit COVID screening.  Call went to voicemail. Unable to do prescreening.  

## 2019-11-16 ENCOUNTER — Other Ambulatory Visit: Payer: Self-pay

## 2019-11-16 ENCOUNTER — Ambulatory Visit (INDEPENDENT_AMBULATORY_CARE_PROVIDER_SITE_OTHER): Payer: Self-pay | Admitting: Internal Medicine

## 2019-11-16 ENCOUNTER — Encounter: Payer: Self-pay | Admitting: Internal Medicine

## 2019-11-16 VITALS — BP 110/66 | HR 80 | Temp 97.5°F | Resp 17 | Wt 168.0 lb

## 2019-11-16 DIAGNOSIS — Z3046 Encounter for surveillance of implantable subdermal contraceptive: Secondary | ICD-10-CM

## 2019-11-16 NOTE — Progress Notes (Signed)
  Subjective:    Ashley Morris - 24 y.o. female MRN [id number]  Date of birth: 04/09/1996  HPI  Ashley Morris is here for Nexplanon removal. Reports it was inserted into her right arm in July 2017. She wishes to have it removed as she desires conception. Declines other birth control methods.      Health Maintenance:  Health Maintenance Due  Topic Date Due  . Hepatitis C Screening  Never done  . COVID-19 Vaccine (1) Never done  . PAP-Cervical Cytology Screening  Never done  . PAP SMEAR-Modifier  Never done    -  reports that she has been smoking cigarettes. She has never used smokeless tobacco. - Review of Systems: Per HPI. - Past Medical History: Patient Active Problem List   Diagnosis Date Noted  . Active labor at term 11/29/2014  . Abdominal pain in pregnancy, antepartum   . Non-reactive NST (non-stress test)   . Noncompliant pregnant patient in third trimester, antepartum 10/07/2014  . Decreased fetal movement affecting management of mother, antepartum 10/07/2014  . Supervision of normal pregnancy in second trimester 06/17/2014  . 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    Appearance: She is not diaphoretic.  Cardiovascular:     Rate and Rhythm: Normal rate.  Pulmonary:     Effort: Pulmonary effort is normal. No respiratory distress.  Musculoskeletal:        General: Normal range of motion.  Skin:    General: Skin is warm and dry.  Neurological:     Mental Status: She is alert and oriented to person, place, and time.  Psychiatric:        Mood and Affect: Affect normal.        Judgment: Judgment normal.     PROCEDURE NOTE: NEXPLANON  REMOVAL Patient given informed consent and signed copy in the chart.  right arm area prepped and draped in the usual sterile fashion. Five cc of lidocaine without epinephrine 1% used for local anesthesia. A small stab incision was made close to the nexplanon with scalpel. Hemostats were used to withdraw the nexplanon. A small bandage was applied with Tegaderm in place. No complications.Patient given follow up instructions should she experience redness, swelling at sight or fever in the next 24 hours. Patient was reminded this totally removes her nexplanon contraceptive devise. (she can now potentially conceive)      Assessment & Plan:   1. Encounter for Nexplanon removal Tolerated procedure well. Discussed that she can now conceive and that she may have been able to conceive this past year, as technically device should have been removed July 2020. Infection precautions and after care discussed.      Marcy Siren, D.O. 11/16/2019, 2:14 PM Primary Care at [facility]

## 2019-12-03 ENCOUNTER — Encounter: Payer: Self-pay | Admitting: Internal Medicine

## 2019-12-03 ENCOUNTER — Other Ambulatory Visit: Payer: Self-pay

## 2019-12-03 ENCOUNTER — Ambulatory Visit: Payer: Self-pay | Attending: Internal Medicine | Admitting: Internal Medicine

## 2019-12-03 VITALS — BP 121/74 | HR 97 | Resp 16 | Ht 67.0 in | Wt 170.8 lb

## 2019-12-03 DIAGNOSIS — Z124 Encounter for screening for malignant neoplasm of cervix: Secondary | ICD-10-CM

## 2019-12-03 DIAGNOSIS — R102 Pelvic and perineal pain: Secondary | ICD-10-CM

## 2019-12-03 DIAGNOSIS — Z113 Encounter for screening for infections with a predominantly sexual mode of transmission: Secondary | ICD-10-CM

## 2019-12-03 LAB — POCT URINALYSIS DIP (CLINITEK)
Bilirubin, UA: NEGATIVE
Blood, UA: NEGATIVE
Glucose, UA: NEGATIVE mg/dL
Ketones, POC UA: NEGATIVE mg/dL
Nitrite, UA: NEGATIVE
POC PROTEIN,UA: NEGATIVE
Spec Grav, UA: >=1.03 — AB (ref 1.010–1.025)
Urobilinogen, UA: 2 E.U./dL — AB
pH, UA: 7 (ref 5.0–8.0)

## 2019-12-03 NOTE — Progress Notes (Signed)
Patient ID: Ashley Morris, female    DOB: 07-Jan-1996  MRN: [id number]  CC: Gynecologic Exam   Subjective: Ashley Morris is a 24 y.o. female who presents for well woman  exam Her concerns today include:   Pt is G2P2 No abn PAP in past.  Last pap was in teen yrs No vaginal dischg.  Active with one partner.  Wanting to get pregnant soon. Had Nexplanon taking out last mth Menses regular lasting 7-10 days with moderate bleeding for first 8 days. Maternal GM with breast CA.  C/O having some pain LT lower abdomen/pelvis x 2 days.  Constant.  No N/V. Soft frequent stools in past 2 days.  No blood in stools.  No dysuria or fever  Last menses end of last mth Wants STI screening.    Patient Active Problem List   Diagnosis Date Noted  . Active labor at term 11/29/2014  . Abdominal pain in pregnancy, antepartum   . Non-reactive NST (non-stress test)   . Noncompliant pregnant patient in third trimester, antepartum 10/07/2014  . Decreased fetal movement affecting management of mother, antepartum 10/07/2014  . Supervision of normal pregnancy in second trimester 06/17/2014  . 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   No current facility-administered medications on file prior to visit.    No Known Allergies  Social History   Socioeconomic History  . Marital status: Single    Spouse name: Not on file  . Number of children: Not on file  . Years of education: Not on file  . Highest education level: Not on file  Occupational History  . Not on file  Tobacco Use  . Smoking status: Current Every Day Smoker    Types: Cigarettes  . Smokeless tobacco: Never Used  Substance and Sexual Activity  . Alcohol use: Yes  . Drug use: Yes    Types: Marijuana  . Sexual activity: Yes    Birth control/protection: None    Comment: threee days ago  Other Topics Concern  . Not on file  Social History Narrative  . Not on file   Social Determinants  of Health   Financial Resource Strain:   . Difficulty of Paying Living Expenses:   Food Insecurity:   . Worried About Programme researcher, broadcasting/film/video in the Last Year:   . Barista in the Last Year:   Transportation Needs:   . Freight forwarder (Medical):   Marland Kitchen Lack of Transportation (Non-Medical):   Physical Activity:   . Days of Exercise per Week:   . Minutes of Exercise per Session:   Stress:   . Feeling of Stress :   Social Connections:   . Frequency of Communication with Friends and Family:   . Frequency of Social Gatherings with Friends and Family:   . Attends Religious Services:   . Active Member of Clubs or Organizations:   . Attends Banker Meetings:   Marland Kitchen Marital Status:   Intimate Partner Violence:   . Fear of Current or Ex-Partner:   . Emotionally Abused:   Marland Kitchen Physically Abused:   . Sexually Abused:     Family History  Problem Relation Age of Onset  . Hypertension Mother   . Anemia Mother     Past Surgical History:  Procedure Laterality Date  . NO PAST SURGERIES      ROS: Review of Systems Negative except as stated above  PHYSICAL EXAM: BP 121/74  Pulse 97   Resp 16   Ht 5\' 7"  (1.702 m)   Wt 170 lb 12.8 oz (77.5 kg)   SpO2 95%   BMI 26.75 kg/m   Wt Readings from Last 3 Encounters:  12/03/19 170 lb 12.8 oz (77.5 kg)  11/16/19 168 lb (76.2 kg)  09/14/18 160 lb (72.6 kg)    Physical Exam  General appearance - alert, well appearing, young African-American female and in no distress Mental status - normal mood, behavior, speech, dress, motor activity, and thought processes Abdomen - soft, nontender, no tenderness in the suprapubic or left lower quadrant areas in particular., nondistended, no masses or organomegaly Breasts -CMA Pollock present for breast and pelvic exam: Breasts appear normal, no suspicious masses, no skin or nipple changes or axillary nodes Pelvic - normal external genitalia, vulva, vagina, cervix, uterus and adnexa.  No  cervical motion tenderness or adnexal masses.    Results for orders placed or performed in visit on 12/03/19  POCT URINALYSIS DIP (CLINITEK)  Result Value Ref Range   Color, UA yellow yellow   Clarity, UA clear clear   Glucose, UA negative negative mg/dL   Bilirubin, UA negative negative   Ketones, POC UA negative negative mg/dL   Spec Grav, UA 12/05/19 (A) 1.010 - 1.025   Blood, UA negative negative   pH, UA 7.0 5.0 - 8.0   POC PROTEIN,UA negative negative, trace   Urobilinogen, UA 2.0 (A) 0.2 or 1.0 E.U./dL   Nitrite, UA Negative Negative   Leukocytes, UA Small (1+) (A) Negative    CMP Latest Ref Rng & Units 09/19/2019 09/14/2018 11/29/2017  Glucose 70 - 99 mg/dL 93 99 84  BUN 6 - 20 mg/dL 8 11 10   Creatinine 0.44 - 1.00 mg/dL 01/30/2018 4.01  Sodium 135 - 145 mmol/L 136 134(L) 140  Potassium 3.5 - 5.1 mmol/L 3.3(L) 3.3(L) 3.4(L)  Chloride 98 - 111 mmol/L 102 103 108  CO2 22 - 32 mmol/L 24 21(L) 21(L)  Calcium 8.9 - 10.3 mg/dL 0.27) 2.53) 9.0  Total Protein 6.5 - 8.1 g/dL 8.2(H) 8.0 -  Total Bilirubin 0.3 - 1.2 mg/dL 0.7 0.9 -  Alkaline Phos 38 - 126 U/L 106 83 -  AST 15 - 41 U/L 58(H) 18 -  ALT 0 - 44 U/L 38 14 -   Lipid Panel  No results found for: CHOL, TRIG, HDL, CHOLHDL, VLDL, LDLCALC, LDLDIRECT  CBC    Component Value Date/Time   WBC 3.9 (L) 09/19/2019 1642   RBC 4.55 09/19/2019 1642   HGB 13.4 09/19/2019 1642   HCT 40.8 09/19/2019 1642   PLT 217 09/19/2019 1642   MCV 89.7 09/19/2019 1642   MCH 29.5 09/19/2019 1642   MCHC 32.8 09/19/2019 1642   RDW 13.2 09/19/2019 1642   LYMPHSABS 1.9 09/19/2019 1642   MONOABS 0.4 09/19/2019 1642   EOSABS 0.0 09/19/2019 1642   BASOSABS 0.0 09/19/2019 1642    ASSESSMENT AND PLAN: 1. Pap smear for cervical cancer screening - Cytology - PAP Recommend taking prenatal vitamins if she plans to get pregnant soon.  2. Pelvic pain Exam is unrevealing.  She has no abdominal tenderness and no cervical motion tenderness on exam.   We will check a UA.  If this is negative, I recommend that she try taking some Pepto-Bismol for the frequent stools.  If symptoms progress with vomiting, blood in the stools or fever, she should be seen in the emergency room. - POCT URINALYSIS DIP (CLINITEK)  3.  Routine screening for STI (sexually transmitted infection) - Cervicovaginal ancillary only - HIV Antibody (routine testing w rflx) - RPR    Patient was given the opportunity to ask questions.  Patient verbalized understanding of the plan and was able to repeat key elements of the plan.   Orders Placed This Encounter  Procedures  . HIV Antibody (routine testing w rflx)  . RPR  . POCT URINALYSIS DIP (CLINITEK)     Requested Prescriptions    No prescriptions requested or ordered in this encounter    No follow-ups on file.  Jonah Blue, MD, FACP

## 2019-12-03 NOTE — Patient Instructions (Signed)
If your urinalysis is negative today, I recommend using some Pepto-Bismol to see if the frequent soft stools will decrease.  You can also take some Tylenol or ibuprofen.  If no improvement or any worsening in the side pain, you should be seen in the emergency room.

## 2019-12-04 LAB — CERVICOVAGINAL ANCILLARY ONLY
Bacterial Vaginitis (gardnerella): POSITIVE — AB
Candida Glabrata: NEGATIVE
Candida Vaginitis: NEGATIVE
Chlamydia: NEGATIVE
Comment: NEGATIVE
Comment: NEGATIVE
Comment: NEGATIVE
Comment: NEGATIVE
Comment: NEGATIVE
Comment: NORMAL
Neisseria Gonorrhea: NEGATIVE
Trichomonas: POSITIVE — AB

## 2019-12-04 LAB — RPR: RPR Ser Ql: NONREACTIVE

## 2019-12-04 LAB — HIV ANTIBODY (ROUTINE TESTING W REFLEX): HIV Screen 4th Generation wRfx: NONREACTIVE

## 2019-12-06 ENCOUNTER — Other Ambulatory Visit: Payer: Self-pay | Admitting: Internal Medicine

## 2019-12-06 MED ORDER — METRONIDAZOLE 500 MG PO TABS: 500.0000 mg | ORAL_TABLET | Freq: Two times a day (BID) | ORAL | 0 refills | Status: DC

## 2019-12-06 NOTE — Progress Notes (Signed)
Let patient know that she tested positive for trichomonas.  Both she and her partner need to be treated before having unprotected sex again.  I have sent a prescription to our pharmacy for the antibiotics called Flagyl.  Screening test for syphilis and HIV were negative.

## 2019-12-07 ENCOUNTER — Other Ambulatory Visit: Payer: Self-pay | Admitting: *Deleted

## 2019-12-07 ENCOUNTER — Telehealth: Payer: Self-pay

## 2019-12-07 LAB — CYTOLOGY - PAP: Diagnosis: NEGATIVE

## 2019-12-07 MED ORDER — METRONIDAZOLE 500 MG PO TABS: 500.0000 mg | ORAL_TABLET | Freq: Two times a day (BID) | ORAL | 0 refills | Status: DC

## 2019-12-07 MED FILL — metroNIDAZOLE 500 MG TABS: 500 | 7 days supply | Qty: 14 | Fill #0

## 2019-12-07 NOTE — Progress Notes (Signed)
Call to pharmacy- (medication is on print) Verbal Rx given as prescribed by provider.

## 2019-12-07 NOTE — Telephone Encounter (Signed)
Contacted pt to go over lab results pt didn't answer lvm asking pt to give a call back at her earliest convenience  

## 2020-01-04 ENCOUNTER — Encounter: Payer: Self-pay | Admitting: Internal Medicine

## 2020-01-26 ENCOUNTER — Telehealth (HOSPITAL_BASED_OUTPATIENT_CLINIC_OR_DEPARTMENT_OTHER): Payer: Self-pay | Admitting: Nurse Practitioner

## 2020-01-26 DIAGNOSIS — Z8619 Personal history of other infectious and parasitic diseases: Secondary | ICD-10-CM

## 2020-01-26 MED ORDER — VALACYCLOVIR HCL 1 G PO TABS: 1000.0000 mg | ORAL_TABLET | Freq: Every day | ORAL | 1 refills | Status: DC

## 2020-01-26 NOTE — Progress Notes (Signed)
Virtual Visit via Telephone Note Due to national recommendations of social distancing due to COVID 19, telehealth visit is felt to be most appropriate for this patient at this time.  I discussed the limitations, risks, security and privacy concerns of performing an evaluation and management service by telephone and the availability of in person appointments. I also discussed with the patient that there may be a patient responsible charge related to this service. The patient expressed understanding and agreed to proceed.    I connected with Ashley Morris on 01/26/20  at   2:30 PM EDT  EDT by telephone and verified that I am speaking with the correct person using two identifiers.   Consent I discussed the limitations, risks, security and privacy concerns of performing an evaluation and management service by telephone and the availability of in person appointments. I also discussed with the patient that there may be a patient responsible charge related to this service. The patient expressed understanding and agreed to proceed.   Location of Patient: Public relations account executive of Provider: [facility] [employer] [facility]    Persons participating in Telemedicine visit: Bertram Denver FNP-BC YY Bien CMA Sharma Dearmas    History of Present Illness: Telemedicine visit for: Possible herpes outbreak  States she is experiencing a herpes breakout on her face, feet and vaginal area. Per patient she was diagnosed with herpes as a teenager around 24 years of age. She pulls her mask down on her video visit and has what appears to be closed comedones on her chin. There are no visible blisters, vesicles or ulcerated macules on her face. She is adamant that she has herpes and states "I know what it looks like!". There is no record of herpes labialis or genitalis on her     Past Medical History:  Diagnosis Date   Asthma    Urinary tract infection     Past Surgical History:  Procedure  Laterality Date   NO PAST SURGERIES      Family History  Problem Relation Age of Onset   Hypertension Mother    Anemia Mother     Social History   Socioeconomic History   Marital status: Single    Spouse name: Not on file   Number of children: Not on file   Years of education: Not on file   Highest education level: Not on file  Occupational History   Not on file  Tobacco Use   Smoking status: Current Every Day Smoker    Types: Cigarettes   Smokeless tobacco: Never Used  Substance and Sexual Activity   Alcohol use: Yes   Drug use: Yes    Types: Marijuana   Sexual activity: Yes    Birth control/protection: None    Comment: threee days ago  Other Topics Concern   Not on file  Social History Narrative   Not on file   Social Determinants of Health   Financial Resource Strain:    Difficulty of Paying Living Expenses: Not on file  Food Insecurity:    Worried About Programme researcher, broadcasting/film/video in the Last Year: Not on file   [employer] of Food in the Last Year: Not on file  Transportation Needs:    Lack of Transportation (Medical): Not on file   Lack of Transportation (Non-Medical): Not on file  Physical Activity:    Days of Exercise per Week: Not on file   Minutes of Exercise per Session: Not on file  Stress:  Feeling of Stress : Not on file  Social Connections:    Frequency of Communication with Friends and Family: Not on file   Frequency of Social Gatherings with Friends and Family: Not on file   Attends Religious Services: Not on file   Active Member of Clubs or Organizations: Not on file   Attends Banker Meetings: Not on file   Marital Status: Not on file     Observations/Objective: Awake, alert and oriented x 3   ROS  Assessment and Plan: Diagnoses and all orders for this visit:  History of herpes genitalis  valACYclovir (VALTREX) 1000 MG tablet; Take 1 tablet (1,000 mg total) by mouth daily for 5 days.     Follow  Up Instructions Return in about 4 weeks (around 02/23/2020) for HSV outbreak.     I discussed the assessment and treatment plan with the patient. The patient was provided an opportunity to ask questions and all were answered. The patient agreed with the plan and demonstrated an understanding of the instructions.   The patient was advised to call back or seek an in-person evaluation if the symptoms worsen or if the condition fails to improve as anticipated.  I provided 14 minutes of non-face-to-face time during this encounter including median intraservice time, reviewing previous notes, labs, imaging, medications and explaining diagnosis and management.  Claiborne Rigg, FNP-BC

## 2020-01-27 ENCOUNTER — Encounter: Payer: Self-pay | Admitting: Nurse Practitioner

## 2020-01-27 ENCOUNTER — Other Ambulatory Visit: Payer: Self-pay | Admitting: Internal Medicine

## 2020-01-27 ENCOUNTER — Other Ambulatory Visit: Payer: Self-pay | Admitting: Nurse Practitioner

## 2020-01-27 MED ORDER — VALACYCLOVIR HCL 1 G PO TABS: 1000.0000 mg | ORAL_TABLET | Freq: Every day | ORAL | 1 refills | Status: AC

## 2020-01-27 MED ORDER — VALACYCLOVIR HCL 1 G PO TABS: 1000.0000 mg | ORAL_TABLET | Freq: Every day | ORAL | 1 refills | Status: DC

## 2020-01-27 NOTE — Telephone Encounter (Signed)
valACYclovir (VALTREX) 1000 MG tablet Medication Date: 01/26/2020 Department: [facility] Ordering/Authorizing: Claiborne Rigg, NP   [id number] [place], [place] - [address] Phone:  [phone]  Fax:  [phone]     [facility] she is having to walk to get medicine

## 2020-01-28 ENCOUNTER — Encounter: Payer: Self-pay | Admitting: Nurse Practitioner

## 2020-02-09 ENCOUNTER — Other Ambulatory Visit: Payer: Self-pay

## 2020-02-09 ENCOUNTER — Ambulatory Visit
Admission: EM | Admit: 2020-02-09 | Discharge: 2020-02-09 | Disposition: A | Discharge status: Home / Self Care | Payer: Self-pay | Attending: Emergency Medicine | Admitting: Emergency Medicine

## 2020-02-09 ENCOUNTER — Encounter: Payer: Self-pay | Admitting: *Deleted

## 2020-02-09 DIAGNOSIS — Z7251 High risk heterosexual behavior: Secondary | ICD-10-CM | POA: Insufficient documentation

## 2020-02-09 DIAGNOSIS — R1030 Lower abdominal pain, unspecified: Secondary | ICD-10-CM | POA: Insufficient documentation

## 2020-02-09 LAB — POCT URINALYSIS DIP (MANUAL ENTRY)
Bilirubin, UA: NEGATIVE
Blood, UA: NEGATIVE
Glucose, UA: NEGATIVE mg/dL
Ketones, POC UA: NEGATIVE mg/dL
Leukocytes, UA: NEGATIVE
Nitrite, UA: NEGATIVE
Protein Ur, POC: NEGATIVE mg/dL
Spec Grav, UA: >=1.03 — AB (ref 1.010–1.025)
Urobilinogen, UA: 2 E.U./dL — AB
pH, UA: 6.5 (ref 5.0–8.0)

## 2020-02-09 LAB — POCT URINE PREGNANCY: Preg Test, Ur: NEGATIVE

## 2020-02-09 MED ORDER — MELOXICAM 7.5 MG PO TABS: 7.5000 mg | ORAL_TABLET | Freq: Every day | ORAL | 0 refills | Status: DC

## 2020-02-09 NOTE — Discharge Instructions (Addendum)

## 2020-02-09 NOTE — ED Triage Notes (Signed)
Patient having complaints of lower abd pain for the last 2 days. Patient describes pain as being sore and states that she experiences cramps off and on. Patient states that she has experienced urgency and frequency in urination. Patient denies any fever or nausea.

## 2020-02-09 NOTE — ED Provider Notes (Signed)
[facility]    CSN: [id number] Arrival date & time: 02/09/20  1054      History   Chief Complaint Chief Complaint  Patient presents with  . Abdominal Pain    HPI Ashley Morris is a 24 y.o. female  Subjective:   Ashley Morris is a 24 y.o. female who presents for evaluation of abdominal pain. The pain is described as cramping, and is. Pain is located in the lower abdomen without radiation. Onset was a few days ago. Symptoms have been stable since. Aggravating factors: movement.  Alleviating factors: OTC analgesia. Associated symptoms: dysuria. The patient denies anorexia, arthralgias, constipation, diarrhea, fever, hematuria, melena, myalgias, nausea and vomiting.  Sexually active w/ 1 female partner.  Has change in discharge w/o malodor or pelvic pain. The following portions of the patient's history were reviewed and updated as appropriate: allergies, current medications, past family history, past medical history, past social history, past surgical history and problem list.      Past Medical History:  Diagnosis Date  . Asthma   . Urinary tract infection     Patient Active Problem List   Diagnosis Date Noted  . Active labor at term 11/29/2014  . Abdominal pain in pregnancy, antepartum   . Non-reactive NST (non-stress test)   . Noncompliant pregnant patient in third trimester, antepartum 10/07/2014  . Decreased fetal movement affecting management of mother, antepartum 10/07/2014  . Supervision of normal pregnancy in second trimester 06/17/2014  . 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 NO PAST SURGERIES      OB History    Gravida  3   Para  2   Term  2   Preterm      AB  1   Living  2     SAB  1   TAB      Ectopic      Multiple  0   Live Births  2            Home Medications    Prior to Admission medications   Medication Sig Start Date End Date Taking? Authorizing Provider  meloxicam  (MOBIC) 7.5 MG tablet Take 1 tablet (7.5 mg total) by mouth daily. 02/09/20   Hall-Potvin, [place], PA-C    Family History Family History  Problem Relation Age of Onset  . Hypertension Mother   . Anemia Mother     Social History Social History   Tobacco Use  . Smoking status: Current Every Day Smoker    Types: Cigarettes  . Smokeless tobacco: Never Used  Vaping Use  . Vaping Use: Some days  Substance Use Topics  . Alcohol use: Yes  . Drug use: Not Currently    Types: Marijuana     Allergies   Patient has no known allergies.   Review of Systems As per HPI   Physical Exam Triage Vital Signs ED Triage Vitals  Enc Vitals Group     BP 02/09/20 1117 124/78     Pulse Rate 02/09/20 1117 86     Resp 02/09/20 1117 20     Temp 02/09/20 1117 97.7 F (36.5 C)     Temp src --      SpO2 02/09/20 1117 97 %     Weight --      Height --      Head Circumference --      Peak Flow --      Pain Score 02/09/20 1116  8     Pain Loc --      Pain Edu? --      Excl. in GC? --    No data found.  Updated Vital Signs BP 124/78 (BP Location: Right Arm)   Pulse 86   Temp 97.7 F (36.5 C)   Resp 20   LMP 01/03/2020   SpO2 97%   Visual Acuity Right Eye Distance:   Left Eye Distance:   Bilateral Distance:    Right Eye Near:   Left Eye Near:    Bilateral Near:     Physical Exam Constitutional:      General: She is not in acute distress. HENT:     Head: Normocephalic and atraumatic.  Eyes:     General: No scleral icterus.    Pupils: Pupils are equal, round, and reactive to light.  Cardiovascular:     Rate and Rhythm: Normal rate.  Pulmonary:     Effort: Pulmonary effort is normal.  Abdominal:     General: Bowel sounds are normal.     Palpations: Abdomen is soft.     Tenderness: There is no abdominal tenderness. There is no right CVA tenderness, left CVA tenderness or guarding.  Genitourinary:    Comments: Patient declined, self-swab performed Skin:     Coloration: Skin is not jaundiced or pale.  Neurological:     Mental Status: She is alert and oriented to person, place, and time.      UC Treatments / Results  Labs (all labs ordered are listed, but only abnormal results are displayed) Labs Reviewed  POCT URINALYSIS DIP (MANUAL ENTRY) - Abnormal; Notable for the following components:      Result Value   Spec Grav, UA >=1.030 (*)    Urobilinogen, UA 2.0 (*)    All other components within normal limits  POCT URINE PREGNANCY  CERVICOVAGINAL ANCILLARY ONLY    EKG   Radiology No results found.  Procedures Procedures (including critical care time)  Medications Ordered in UC Medications - No data to display  Initial Impression / Assessment and Plan / UC Course  I have reviewed the triage vital signs and the nursing notes.  Pertinent labs & imaging results that were available during my care of the patient were reviewed by me and considered in my medical decision making (see chart for details).     Appears well.  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  Low concern for torsion, PID, ovarian cyst.  Could be cycle related: preg negative.  Cytology pending, urine as above.  Will treat supportively & keep f/u w/ HCP next week.  ER return precautions discussed, pt verbalized understanding and is agreeable to plan. Final Clinical Impressions(s) / UC Diagnoses   Final diagnoses:  Unprotected sex  Lower abdominal pain     Discharge Instructions     Testing for chlamydia, gonorrhea, trichomonas is pending: please look for these results on the MyChart app/website.  We will notify you if you are positive and outline treatment at that time.  Important to avoid all forms of sexual intercourse (oral, vaginal, anal) with any/all partners for the next 7 days to avoid spreading/reinfecting. Any/all sexual partners should be notified of testing/treatment today.  Return for persistent/worsening symptoms or if you develop  fever, abdominal or pelvic pain, discharge, genital pain, blood in your urine, or are re-exposed to an STI.    ED Prescriptions    Medication Sig Dispense Auth. Provider   meloxicam (  MOBIC) 7.5 MG tablet Take 1 tablet (7.5 mg total) by mouth daily. 14 tablet Hall-Potvin, [place], PA-C     PDMP not reviewed this encounter.   Hall-Potvin, [place], [place] 02/09/20 1324

## 2020-02-12 ENCOUNTER — Ambulatory Visit: Payer: Self-pay | Admitting: Family Medicine

## 2020-02-12 LAB — CERVICOVAGINAL ANCILLARY ONLY
Chlamydia: NEGATIVE
Comment: NEGATIVE
Comment: NEGATIVE
Comment: NORMAL
Neisseria Gonorrhea: NEGATIVE
Trichomonas: NEGATIVE

## 2020-03-17 ENCOUNTER — Telehealth: Payer: Self-pay

## 2020-03-17 ENCOUNTER — Ambulatory Visit: Payer: Self-pay | Attending: Family Medicine | Admitting: Family Medicine

## 2020-03-17 ENCOUNTER — Other Ambulatory Visit: Payer: Self-pay

## 2020-03-17 DIAGNOSIS — Z5329 Procedure and treatment not carried out because of patient's decision for other reasons: Secondary | ICD-10-CM

## 2020-03-17 DIAGNOSIS — Z91199 Patient's noncompliance with other medical treatment and regimen due to unspecified reason: Secondary | ICD-10-CM

## 2020-03-17 NOTE — Telephone Encounter (Signed)
Att to contact pt for virtual visit phone just rings or goes directly to vm and mailbox is full unable to lvm

## 2020-04-02 NOTE — Progress Notes (Signed)
Patient ID: Ashley Morris, female   DOB: 04-Feb-1996, 24 y.o.   MRN: [id number]   Patient could not be contacted to start virtual visit as she had not provided a valid phone number when attempts were made x3 to contact patient

## 2020-04-22 ENCOUNTER — Telehealth: Payer: Self-pay | Admitting: Family Medicine

## 2020-04-25 ENCOUNTER — Ambulatory Visit: Payer: Self-pay | Admitting: Physician Assistant

## 2020-05-11 ENCOUNTER — Ambulatory Visit: Payer: Self-pay | Admitting: Physician Assistant

## 2020-05-17 ENCOUNTER — Other Ambulatory Visit: Payer: Self-pay

## 2020-05-17 ENCOUNTER — Ambulatory Visit
Admission: EM | Admit: 2020-05-17 | Discharge: 2020-05-17 | Disposition: A | Discharge status: Home / Self Care | Payer: Self-pay | Attending: Emergency Medicine | Admitting: Emergency Medicine

## 2020-05-17 DIAGNOSIS — M5442 Lumbago with sciatica, left side: Secondary | ICD-10-CM

## 2020-05-17 DIAGNOSIS — M5441 Lumbago with sciatica, right side: Secondary | ICD-10-CM

## 2020-05-17 LAB — POCT URINE PREGNANCY: Preg Test, Ur: NEGATIVE

## 2020-05-17 MED ORDER — NAPROXEN 500 MG PO TABS: 500.0000 mg | ORAL_TABLET | Freq: Two times a day (BID) | ORAL | 0 refills | Status: DC

## 2020-05-17 NOTE — ED Provider Notes (Signed)
[facility]    CSN: [id number] Arrival date & time: 05/17/20  0851      History   Chief Complaint Chief Complaint  Patient presents with  . Abdominal Pain  . Back Pain    HPI Ashley Morris is a 24 y.o. female  presents for lateral low back pain since last night.  States pain is sharp, shooting at times, will radiate down around to her front and down her legs.  Has tried not tried anything for relief.  Denies trauma/injury to the affected area and does not recall an inciting event.  Denies fever, saddle area anesthesia, lower extremity numbness/weakness, urinary retention, fecal incontinence.  Unsure if pregnant: LMP 04/04/2020.  Past Medical History:  Diagnosis Date  . Asthma   . Urinary tract infection     Patient Active Problem List   Diagnosis Date Noted  . Active labor at term 11/29/2014  . Abdominal pain in pregnancy, antepartum   . Non-reactive NST (non-stress test)   . Noncompliant pregnant patient in third trimester, antepartum 10/07/2014  . Decreased fetal movement affecting management of mother, antepartum 10/07/2014  . Supervision of normal pregnancy in second trimester 06/17/2014  . 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 NO PAST SURGERIES      OB History    Gravida  3   Para  2   Term  2   Preterm      AB  1   Living  2     SAB  1   IAB      Ectopic      Multiple  0   Live Births  2            Home Medications    Prior to Admission medications   Medication Sig Start Date End Date Taking? Authorizing Provider  naproxen (NAPROSYN) 500 MG tablet Take 1 tablet (500 mg total) by mouth 2 (two) times daily. 05/17/20  Yes Hall-Potvin, [place], PA-C    Family History Family History  Problem Relation Age of Onset  . Hypertension Mother   . Anemia Mother     Social History Social History   Tobacco Use  . Smoking status: Current Every Day Smoker    Types: Cigarettes   . Smokeless tobacco: Never Used  Vaping Use  . Vaping Use: Some days  Substance Use Topics  . Alcohol use: Yes  . Drug use: Not Currently    Types: Marijuana     Allergies   Patient has no known allergies.   Review of Systems Review of Systems  Constitutional: Negative for fatigue and fever.  HENT: Negative for ear pain, sinus pain, sore throat and voice change.   Eyes: Negative for pain, redness and visual disturbance.  Respiratory: Negative for cough and shortness of breath.   Cardiovascular: Negative for chest pain and palpitations.  Gastrointestinal: Negative for abdominal pain, diarrhea and vomiting.  Genitourinary: Negative for dysuria, frequency and urgency.  Musculoskeletal: Positive for back pain. Negative for arthralgias and myalgias.  Skin: Negative for rash and wound.  Neurological: Negative for syncope and headaches.     Physical Exam Triage Vital Signs ED Triage Vitals  Enc Vitals Group     BP 05/17/20 0941 114/68     Pulse Rate 05/17/20 0941 98     Resp 05/17/20 0941 18     Temp 05/17/20 0941 98.7 F (37.1 C)     Temp Source 05/17/20 0941 Oral  SpO2 05/17/20 0941 97 %     Weight --      Height --      Head Circumference --      Peak Flow --      Pain Score 05/17/20 1004 10     Pain Loc --      Pain Edu? --      Excl. in GC? --    No data found.  Updated Vital Signs BP 114/68   Pulse 98   Temp 98.7 F (37.1 C) (Oral)   Resp 18   LMP 04/04/2020   SpO2 97%   Breastfeeding No   Visual Acuity Right Eye Distance:   Left Eye Distance:   Bilateral Distance:    Right Eye Near:   Left Eye Near:    Bilateral Near:     Physical Exam Constitutional:      General: She is not in acute distress. HENT:     Head: Normocephalic and atraumatic.  Eyes:     General: No scleral icterus.    Pupils: Pupils are equal, round, and reactive to light.  Cardiovascular:     Rate and Rhythm: Normal rate.  Pulmonary:     Effort: Pulmonary effort is  normal.  Musculoskeletal:        General: Tenderness present. No swelling. Normal range of motion.     Comments: Bilateral lumbar tenderness that spares spinous process, PSIS.  Negative SLR bilaterally.  NVI  Skin:    Coloration: Skin is not jaundiced or pale.  Neurological:     Mental Status: She is alert and oriented to person, place, and time.      UC Treatments / Results  Labs (all labs ordered are listed, but only abnormal results are displayed) Labs Reviewed  POCT URINE PREGNANCY    EKG   Radiology No results found.  Procedures Procedures (including critical care time)  Medications Ordered in UC Medications - No data to display  Initial Impression / Assessment and Plan / UC Course  I have reviewed the triage vital signs and the nursing notes.  Pertinent labs & imaging results that were available during my care of the patient were reviewed by me and considered in my medical decision making (see chart for details).     Urine pregnancy negative, will treat for MSK as below.  Return precautions discussed, pt verbalized understanding and is agreeable to plan. Final Clinical Impressions(s) / UC Diagnoses   Final diagnoses:  Acute bilateral low back pain with bilateral sciatica     Discharge Instructions     RICE: rest, ice, compression, elevation as needed for pain.    Pain medication:  500 mg Naprosyn/Aleve (naproxen) every 12 hours with food:  AVOID other NSAIDs while taking this (may have Tylenol).  Important to follow up with specialist(s) below for further evaluation/management if your symptoms persist or worsen.    ED Prescriptions    Medication Sig Dispense Auth. Provider   naproxen (NAPROSYN) 500 MG tablet Take 1 tablet (500 mg total) by mouth 2 (two) times daily. 30 tablet Hall-Potvin, [place], PA-C     I have reviewed the PDMP during this encounter.   Hall-Potvin, [place], [place] 05/17/20 1049

## 2020-05-17 NOTE — ED Triage Notes (Signed)
Pt c/o lower back pain radiating around to lower abdomen and down both legs since last night and has vomit x2 since last night. Denies injury.

## 2020-05-17 NOTE — Discharge Instructions (Signed)
RICE: rest, ice, compression, elevation as needed for pain.    Pain medication:  500 mg Naprosyn/Aleve (naproxen) every 12 hours with food:  AVOID other NSAIDs while taking this (may have Tylenol).  Important to follow up with specialist(s) below for further evaluation/management if your symptoms persist or worsen. 

## 2020-05-25 ENCOUNTER — Telehealth: Payer: Self-pay | Admitting: Family Medicine

## 2020-05-25 NOTE — Telephone Encounter (Unsigned)
Copied from CRM [id number]. Topic: General - Other >> May 25, 2020  9:57 AM Marylen Ponto wrote: Reason for CRM: Pt requests to speak with a nurse as she is concerned with some bumps on her tongue and vaginal area. Cb# [phone]

## 2020-05-26 ENCOUNTER — Ambulatory Visit: Payer: Self-pay | Admitting: *Deleted

## 2020-05-26 NOTE — Telephone Encounter (Signed)
Will forward to pcp

## 2020-05-26 NOTE — Telephone Encounter (Signed)
Reason for CRM: Pt requests to speak with a nurse as she is concerned with some bumps on her tongue and vaginal area. Cb# [phone] Call to patient- patient reports she has cystic pus filled bumps in groin area( present ( 1 month) and also has painful flesh colored bumps at the back  of her tongue( present a few days). Explained to patient that she may have 2 different problems occurring. Protocol states that she be seen within 4 hours- no appointment are available at office- advised would send her message over for review and scheduling- but if there is no appointment- she may be directed to UC for evaluation.  Reason for Disposition . [1] Rash is painful to touch AND [2] fever  Answer Assessment - Initial Assessment Questions 1. APPEARANCE of RASH: "Describe the rash."      More cystic bumps on skin- pus filled 2. LOCATION: "Where is the rash located?"      Couple small bumps in groin, back of tongue- 4 3. NUMBER: "How many spots are there?"      8 in total 4. SIZE: "How big are the spots?" (Inches, centimeters or compare to size of a coin)      Tip of pencil- small 5. ONSET: "When did the rash start?"      1 month ago-groin and few days ago- back of tongue 6. ITCHING: "Does the rash itch?" If Yes, ask: "How bad is the itch?"  (Scale 1-10; or mild, moderate, severe)     Sometimes- groin 7. PAIN: "Does the rash hurt?" If Yes, ask: "How bad is the pain?"  (Scale 1-10; or mild, moderate, severe)     Yes- moderate 8. OTHER SYMPTOMS: "Do you have any other symptoms?" (e.g., fever)     no 9. PREGNANCY: "Is there any chance you are pregnant?" "When was your last menstrual period?"     Not sexually active since period - LMP- 05/04/20  Protocols used: RASH OR REDNESS - LOCALIZED-A-AH

## 2020-05-26 NOTE — Telephone Encounter (Signed)
See triage note- patient request appointment be moved up.

## 2020-05-26 NOTE — Telephone Encounter (Signed)
Copied from CRM [id number]. Topic: Appointment Scheduling - Scheduling Inquiry for Clinic >> May 24, 2020  4:20 PM Randol Kern wrote: [phone] Pt needs an appt as soon as possible, anytime after 12:00 pm. Please advise, she declined the next available appt and also declined urgent care because they cannot help her she states. Please advise if anything can be done   Called patient to offer sooner appointment with [facility] Reached VM and [facility].

## 2020-05-26 NOTE — Telephone Encounter (Signed)
Tried reaching out to pt and was unable to reach her wasn't able to lvm due to vm being full

## 2020-05-27 NOTE — Telephone Encounter (Signed)
Looked at schedule before calling pt and there is no availability. Contacted pt and made aware that we don't have any available appointments and she can go to the urgent care to be evaluated.

## 2020-07-07 ENCOUNTER — Ambulatory Visit: Payer: Self-pay | Admitting: Internal Medicine

## 2020-09-16 ENCOUNTER — Ambulatory Visit: Payer: Self-pay | Admitting: Internal Medicine

## 2020-12-21 ENCOUNTER — Other Ambulatory Visit: Payer: Self-pay

## 2020-12-21 ENCOUNTER — Emergency Department (HOSPITAL_COMMUNITY)
Admission: EM | Admit: 2020-12-21 | Discharge: 2020-12-22 | Disposition: A | Discharge status: Home / Self Care | Payer: Self-pay | Attending: Emergency Medicine | Admitting: Emergency Medicine

## 2020-12-21 DIAGNOSIS — Z5321 Procedure and treatment not carried out due to patient leaving prior to being seen by health care provider: Secondary | ICD-10-CM | POA: Insufficient documentation

## 2020-12-21 DIAGNOSIS — M791 Myalgia, unspecified site: Secondary | ICD-10-CM | POA: Insufficient documentation

## 2020-12-21 DIAGNOSIS — M542 Cervicalgia: Secondary | ICD-10-CM | POA: Insufficient documentation

## 2020-12-21 NOTE — ED Triage Notes (Signed)
Pt here c/o neck pain that started yesterday, pain moved into chest today. Pt requesting covid test. C/o generalized body aches, denies fevers and sob.

## 2020-12-22 ENCOUNTER — Ambulatory Visit
Admission: EM | Admit: 2020-12-22 | Discharge: 2020-12-22 | Disposition: A | Discharge status: Home / Self Care | Payer: Self-pay | Attending: Family Medicine | Admitting: Family Medicine

## 2020-12-22 DIAGNOSIS — R52 Pain, unspecified: Secondary | ICD-10-CM

## 2020-12-22 DIAGNOSIS — R6883 Chills (without fever): Secondary | ICD-10-CM

## 2020-12-22 DIAGNOSIS — J029 Acute pharyngitis, unspecified: Secondary | ICD-10-CM

## 2020-12-22 MED ORDER — KETOROLAC TROMETHAMINE 30 MG/ML IJ SOLN: 30.0000 mg | Freq: Once | INTRAMUSCULAR | Status: DC

## 2020-12-22 NOTE — ED Triage Notes (Signed)
Patient presents to Urgent Care with complaints of body aches and chills since yesterday.

## 2020-12-22 NOTE — ED Notes (Signed)
Pt called x2 for vitals recheck with no response 

## 2020-12-22 NOTE — ED Provider Notes (Signed)
[facility]    CSN: [id number] Arrival date & time: 12/22/20  0932      History   Chief Complaint Chief Complaint  Patient presents with   Generalized Body Aches   Chills    HPI Karleigh Bunte is a 25 y.o. female.   Patient presenting today with 2-day history of generalized body aches, chills, sweats, significant fatigue, intermittent sore throat.  Denies chest pain, shortness of breath, cough, congestion, abdominal pain, nausea vomiting or diarrhea.  So far not trying anything over-the-counter for symptoms.  No known sick contacts recently.  No known chronic medical problems.   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 Authorizing Provider  naproxen (NAPROSYN) 500 MG tablet Take 1 tablet (500 mg total) by mouth 2 (two) times daily. 05/17/20   Hall-Potvin, [place], PA-C    Family History Family History  Problem Relation Age of Onset   Hypertension Mother    Anemia Mother     Social History Social History   Tobacco Use   Smoking status: Every Day    Types: Cigarettes   Smokeless tobacco: Never  Vaping Use   Vaping Use: Some days  Substance Use  Topics   Alcohol use: Yes   Drug use: Not Currently    Types: Marijuana     Allergies   Patient has no known allergies.   Review of Systems Review of Systems Per HPI  Physical Exam Triage Vital Signs ED Triage Vitals  Enc Vitals Group     BP 12/22/20 0948 118/74     Pulse Rate 12/22/20 0948 86     Resp 12/22/20 0948 16     Temp 12/22/20 0948 98.3 F (36.8 C)     Temp Source 12/22/20 0948 Oral     SpO2 12/22/20 0948 97 %     Weight --      Height --      Head Circumference --      Peak Flow --      Pain Score 12/22/20 0946 9     Pain Loc --      Pain Edu? --      Excl. in GC? --    No data found.  Updated Vital Signs BP 118/74 (BP Location: Left Arm)   Pulse 86   Temp 98.3 F (36.8 C) (Oral)   Resp 16  LMP 11/28/2020 (Exact Date)   SpO2 97%   Visual Acuity Right Eye Distance:   Left Eye Distance:   Bilateral Distance:    Right Eye Near:   Left Eye Near:    Bilateral Near:     Physical Exam Vitals and nursing note reviewed.  Constitutional:      Appearance: She is not ill-appearing.     Comments: Sleeping upon entering the room  HENT:     Head: Atraumatic.     Right Ear: Tympanic membrane normal.     Left Ear: Tympanic membrane normal.     Nose: Nose normal.     Mouth/Throat:     Mouth: Mucous membranes are moist.     Pharynx: Oropharynx is clear. No posterior oropharyngeal erythema.  Eyes:     Extraocular Movements: Extraocular movements intact.     Conjunctiva/sclera: Conjunctivae normal.  Cardiovascular:     Rate and Rhythm: Normal rate and regular rhythm.     Heart sounds: Normal heart sounds.  Pulmonary:     Effort: Pulmonary effort is normal.     Breath sounds: Normal breath sounds.  Abdominal:     General: Bowel sounds are normal. There is no distension.     Palpations: Abdomen is soft.     Tenderness: There is no abdominal tenderness. There is no right CVA tenderness, left CVA tenderness or guarding.  Musculoskeletal:         General: Normal range of motion.     Cervical back: Normal range of motion and neck supple.  Skin:    General: Skin is warm and dry.  Neurological:     Mental Status: She is alert and oriented to person, place, and time.     Motor: No weakness.     Gait: Gait normal.  Psychiatric:        Mood and Affect: Mood normal.        Thought Content: Thought content normal.        Judgment: Judgment normal.   UC Treatments / Results  Labs (all labs ordered are listed, but only abnormal results are displayed) Labs Reviewed  NOVEL CORONAVIRUS, NAA    EKG   Radiology No results found.  Procedures Procedures (including critical care time)  Medications Ordered in UC Medications  ketorolac (TORADOL) 30 MG/ML injection 30 mg (has no administration in time range)    Initial Impression / Assessment and Plan / UC Course  I have reviewed the triage vital signs and the nursing notes.  Pertinent labs & imaging results that were available during my care of the patient were reviewed by me and considered in my medical decision making (see chart for details).     Vital signs and exam benign and reassuring, suspect viral illness causing her generalized symptoms.  COVID PCR pending, ibuprofen, Tylenol, supportive over-the-counter medications and home care reviewed.  Offered Toradol which she initially agreed to but at time of administration then declined.  Work note given with Barrister's clerk.  Return for acutely worsening symptoms  Final Clinical Impressions(s) / UC Diagnoses   Final diagnoses:  Generalized body aches  Chills  Sore throat   Discharge Instructions   None    ED Prescriptions   None    PDMP not reviewed this encounter.   Particia Nearing, [place] 12/22/20 1032

## 2020-12-23 LAB — NOVEL CORONAVIRUS, NAA: SARS-CoV-2, NAA: NOT DETECTED

## 2020-12-23 LAB — SARS-COV-2, NAA 2 DAY TAT

## 2021-01-31 ENCOUNTER — Encounter (HOSPITAL_COMMUNITY): Payer: Self-pay

## 2021-01-31 ENCOUNTER — Ambulatory Visit (HOSPITAL_COMMUNITY)
Admission: EM | Admit: 2021-01-31 | Discharge: 2021-01-31 | Disposition: A | Discharge status: Home / Self Care | Payer: Medicaid Other | Attending: Emergency Medicine | Admitting: Emergency Medicine

## 2021-01-31 ENCOUNTER — Other Ambulatory Visit: Payer: Self-pay

## 2021-01-31 DIAGNOSIS — N926 Irregular menstruation, unspecified: Secondary | ICD-10-CM

## 2021-01-31 LAB — POCT URINALYSIS DIPSTICK, ED / UC
Bilirubin Urine: NEGATIVE
Glucose, UA: NEGATIVE mg/dL
Hgb urine dipstick: NEGATIVE
Ketones, ur: NEGATIVE mg/dL
Nitrite: NEGATIVE
Protein, ur: NEGATIVE mg/dL
Specific Gravity, Urine: 1.02 (ref 1.005–1.030)
Urobilinogen, UA: 0.2 mg/dL (ref 0.0–1.0)
pH: 7 (ref 5.0–8.0)

## 2021-01-31 LAB — POC URINE PREG, ED: Preg Test, Ur: NEGATIVE

## 2021-01-31 LAB — HCG, QUANTITATIVE, PREGNANCY: hCG, Beta Chain, Quant, S: 5021 m[IU]/mL — ABNORMAL HIGH (ref <5)

## 2021-01-31 NOTE — ED Provider Notes (Signed)
[facility]    CSN: [id number] Arrival date & time: 01/31/21  1019      History   Chief Complaint Chief Complaint  Patient presents with   Possible Pregnancy   Abdominal Pain    HPI Ashley Morris is a 25 y.o. female.   Patient presents with lower abdominal soreness and bilateral breast tenderness and heaviness, intermittent nausea, urinary frequency and urgency for 1 week.  Endorses dysuria twice.  Denies fever, chills, body aches, URI symptoms, diarrhea, changes in diet, recent travel, heartburn indigestion, abdominal bloating, increased gas production, discharge from the nipple, dimpling of breast, cracking of nipple, nodules within breast tissue, hematuria, flank pain, discharge, itching, odor.  Last menstrual period 12/01/2020.  At home pregnancy test positive.   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 Authorizing Provider  naproxen (NAPROSYN) 500 MG tablet Take 1 tablet (500 mg total) by mouth 2 (two) times daily. 05/17/20   Hall-Potvin, [place], PA-C    Family History Family History  Problem Relation Age of Onset   Hypertension Mother    Anemia  Mother     Social History Social History   Tobacco Use   Smoking status: Every Day    Types: Cigarettes   Smokeless tobacco: Never  Vaping Use   Vaping Use: Some days  Substance Use Topics   Alcohol use: Yes   Drug use: Not Currently    Types: Marijuana     Allergies   Patient has no known allergies.   Review of Systems Review of Systems defer to HPI   Physical Exam Triage Vital Signs ED Triage Vitals  Enc Vitals Group     BP 01/31/21 1129 115/63     Pulse Rate 01/31/21 1129 72     Resp 01/31/21 1129 17     Temp 01/31/21 1129 97.8 F (36.6 C)     Temp Source 01/31/21 1129 Oral     SpO2 01/31/21 1129 100 %     Weight --      Height --      Head Circumference --      Peak Flow --      Pain Score 01/31/21 1128 7     Pain Loc --      Pain Edu? --      Excl. in GC? --    No data found.  Updated  Vital Signs BP 115/63 (BP Location: Left Arm)   Pulse 72   Temp 97.8 F (36.6 C) (Oral)   Resp 17   LMP 12/01/2020 (Approximate)   SpO2 100%   Visual Acuity Right Eye Distance:   Left Eye Distance:   Bilateral Distance:    Right Eye Near:   Left Eye Near:    Bilateral Near:     Physical Exam Constitutional:      Appearance: She is well-developed and normal weight.  Eyes:     Extraocular Movements: Extraocular movements intact.  Cardiovascular:     Rate and Rhythm: Normal rate and regular rhythm.  Pulmonary:     Effort: Pulmonary effort is normal.  Abdominal:     General: Abdomen is flat. Bowel sounds are normal.     Palpations: Abdomen is soft.     Tenderness: There is abdominal tenderness in the suprapubic area. There is no right CVA tenderness or left CVA tenderness.  Skin:    General: Skin is warm and dry.  Neurological:     Mental Status: She is alert and oriented to person, place, and time. Mental status is at baseline.  Psychiatric:        Mood and Affect: Mood normal.        Behavior: Behavior normal.     UC Treatments / Results   Labs (all labs ordered are listed, but only abnormal results are displayed) Labs Reviewed  POCT URINALYSIS DIPSTICK, ED / UC - Abnormal; Notable for the following components:      Result Value   Leukocytes,Ua MODERATE (*)    All other components within normal limits  POC URINE PREG, ED    EKG   Radiology No results found.  Procedures Procedures (including critical care time)  Medications Ordered in UC Medications - No data to display  Initial Impression / Assessment and Plan / UC Course  I have reviewed the triage vital signs and the nursing notes.  Pertinent labs & imaging results that were available during my care of the patient were reviewed by me and considered in my medical decision making (see chart for details).  Missed menses  Continued symptoms seem to be hormonal however we will complete basic labs and diagnostics to rule out possible causes and infections, discussed with patient, in agreement with plan  1.  Urine pregnancy negative, will obtain hCG level, while discussing results patient explained that she had Nexplanon implanted device removed in March and has irregular menstruation prior to and after removal 2.  Urinalysis showing moderate Yang Rack blood cells, sent for culture 3.  Patient sexually active without condom use, will obtain STI screening, will treat per protocol, advised abstinence until labs result and/or treatment is complete Final Clinical Impressions(s) / UC Diagnoses   Final diagnoses:  None   Discharge Instructions   None    ED Prescriptions   None    PDMP not reviewed this encounter.   Valinda Hoar, NP 01/31/21 1452

## 2021-01-31 NOTE — ED Triage Notes (Signed)
Pt in with c/o breast pain and abdominal pain x 1 week.  States she has missed 2 periods and she took an at home test with positive results 2 days ago

## 2021-01-31 NOTE — Discharge Instructions (Addendum)
Your pregnancy test today was negative, however due to you having a positive test at home we will do blood work to determine pregnancy, you will be notified of positive results  Your urinalysis today showed moderate amount of Mare Ludtke blood cells which can be seen during times of infection therefore it will be sent to the lab to see if it grows bacteria, if this occurs, you will be notified and medication sent into the pharmacy  STI screening pending, you will be notified of any positive results, if positive medication will be sent to the pharmacy, you will only have to return to clinic if positive for gonorrhea

## 2021-02-01 LAB — CERVICOVAGINAL ANCILLARY ONLY
Bacterial Vaginitis (gardnerella): POSITIVE — AB
Candida Glabrata: NEGATIVE
Candida Vaginitis: NEGATIVE
Chlamydia: NEGATIVE
Comment: NEGATIVE
Comment: NEGATIVE
Comment: NEGATIVE
Comment: NEGATIVE
Comment: NEGATIVE
Comment: NORMAL
Neisseria Gonorrhea: NEGATIVE
Trichomonas: POSITIVE — AB

## 2021-02-01 LAB — URINE CULTURE: Culture: NO GROWTH

## 2021-02-02 ENCOUNTER — Telehealth (HOSPITAL_COMMUNITY): Payer: Self-pay | Admitting: Emergency Medicine

## 2021-02-02 MED ORDER — METRONIDAZOLE 500 MG PO TABS: 500.0000 mg | ORAL_TABLET | Freq: Two times a day (BID) | ORAL | 0 refills | Status: DC

## 2021-02-03 ENCOUNTER — Ambulatory Visit: Payer: Self-pay | Attending: Nurse Practitioner | Admitting: Nurse Practitioner

## 2021-02-03 ENCOUNTER — Other Ambulatory Visit: Payer: Self-pay

## 2021-02-03 ENCOUNTER — Encounter: Payer: Self-pay | Admitting: Nurse Practitioner

## 2021-02-03 VITALS — BP 124/72 | HR 78 | Ht 67.0 in | Wt 172.4 lb

## 2021-02-03 DIAGNOSIS — Z8619 Personal history of other infectious and parasitic diseases: Secondary | ICD-10-CM

## 2021-02-03 DIAGNOSIS — N912 Amenorrhea, unspecified: Secondary | ICD-10-CM

## 2021-02-03 DIAGNOSIS — E349 Endocrine disorder, unspecified: Secondary | ICD-10-CM

## 2021-02-03 MED ORDER — VALACYCLOVIR HCL 1 G PO TABS: 2000.0000 mg | ORAL_TABLET | Freq: Every day | ORAL | 1 refills | Status: AC

## 2021-02-03 NOTE — Progress Notes (Signed)
Assessment & Plan:  Ashley Morris was seen today for amenorrhea.  Diagnoses and all orders for this visit:  Elevated serum hCG -     Ambulatory referral to Obstetrics / Gynecology Letter given today for her to take and apply for medicaid  History of herpes genitalis -     valACYclovir (VALTREX) 1000 MG tablet; Take 2 tablets (2,000 mg total) by mouth daily for 5 days.  Amenorrhea -     Ambulatory referral to Obstetrics / Gynecology   Patient has been counseled on age-appropriate routine health concerns for screening and prevention. These are reviewed and up-to-date. Referrals have been placed accordingly. Immunizations are up-to-date or declined.    Subjective:   Chief Complaint  Patient presents with   Amenorrhea   HPI Ashley Morris 25 y.o. female presents to office today requesting work up "to check to see if the baby is okay"and for refill of valtrex for herpes outbreak. She recently had a positive HCG serum pregnancy test on 01-31-2021. She is also currently being treated for trichomonas and BV. The hallway and exam room smell of marijuana  Does not endorse any vaginal bleeding at this time.   Review of Systems  Constitutional:  Negative for fever, malaise/fatigue and weight loss.  HENT: Negative.  Negative for nosebleeds.   Eyes: Negative.  Negative for blurred vision, double vision and photophobia.  Respiratory: Negative.  Negative for cough and shortness of breath.   Cardiovascular: Negative.  Negative for chest pain, palpitations and leg swelling.  Gastrointestinal: Negative.  Negative for heartburn, nausea and vomiting.  Musculoskeletal: Negative.  Negative for myalgias.  Neurological: Negative.  Negative for dizziness, focal weakness, seizures and headaches.  Psychiatric/Behavioral: Negative.  Negative for suicidal ideas.    Past Medical History:  Diagnosis Date   Asthma    Urinary tract infection     Past Surgical History:  Procedure Laterality Date   NO PAST  SURGERIES      Family History  Problem Relation Age of Onset   Hypertension Mother    Anemia Mother     Social History Reviewed with no changes to be made today.   Outpatient Medications Prior to Visit  Medication Sig Dispense Refill   metroNIDAZOLE (FLAGYL) 500 MG tablet Take 1 tablet (500 mg total) by mouth 2 (two) times daily. 14 tablet 0   naproxen (NAPROSYN) 500 MG tablet Take 1 tablet (500 mg total) by mouth 2 (two) times daily. 30 tablet 0   No facility-administered medications prior to visit.    No Known Allergies     Objective:    BP 124/72   Pulse 78   Ht 5\' 7"  (1.702 m)   Wt 172 lb 6 oz (78.2 kg)   LMP 12/01/2020 (Approximate)   SpO2 99%   BMI 27.00 kg/m  Wt Readings from Last 3 Encounters:  02/03/21 172 lb 6 oz (78.2 kg)  12/03/19 170 lb 12.8 oz (77.5 kg)  11/16/19 168 lb (76.2 kg)    Physical Exam Vitals and nursing note reviewed.  Constitutional:      Appearance: She is well-developed.  HENT:     Head: Normocephalic and atraumatic.  Cardiovascular:     Rate and Rhythm: Normal rate and regular rhythm.     Heart sounds: Normal heart sounds. No murmur heard.   No friction rub. No gallop.  Pulmonary:     Effort: Pulmonary effort is normal. No tachypnea or respiratory distress.     Breath sounds: Normal breath sounds. No  decreased breath sounds, wheezing, rhonchi or rales.  Chest:     Chest wall: No tenderness.  Abdominal:     General: Bowel sounds are normal.     Palpations: Abdomen is soft.  Musculoskeletal:        General: Normal range of motion.     Cervical back: Normal range of motion.  Skin:    General: Skin is warm and dry.  Neurological:     Mental Status: She is alert and oriented to person, place, and time.     Coordination: Coordination normal.  Psychiatric:        Behavior: Behavior normal. Behavior is cooperative.        Thought Content: Thought content normal.        Judgment: Judgment normal.         Patient has been  counseled extensively about nutrition and exercise as well as the importance of adherence with medications and regular follow-up. The patient was given clear instructions to go to ER or return to medical center if symptoms don't improve, worsen or new problems develop. The patient verbalized understanding.   Follow-up: Return if symptoms worsen or fail to improve.   Claiborne Rigg, FNP-BC [facility] and [facility] [place], [place] [phone]   02/03/2021, 10:08 AM

## 2021-02-04 ENCOUNTER — Emergency Department (HOSPITAL_COMMUNITY): Payer: Medicaid Other

## 2021-02-04 ENCOUNTER — Other Ambulatory Visit: Payer: Self-pay

## 2021-02-04 ENCOUNTER — Emergency Department (HOSPITAL_COMMUNITY)
Admission: EM | Admit: 2021-02-04 | Discharge: 2021-02-04 | Disposition: A | Discharge status: Home / Self Care | Payer: Medicaid Other | Attending: Emergency Medicine | Admitting: Emergency Medicine

## 2021-02-04 ENCOUNTER — Encounter (HOSPITAL_COMMUNITY): Payer: Self-pay | Admitting: Emergency Medicine

## 2021-02-04 DIAGNOSIS — Z3491 Encounter for supervision of normal pregnancy, unspecified, first trimester: Secondary | ICD-10-CM

## 2021-02-04 DIAGNOSIS — O23591 Infection of other part of genital tract in pregnancy, first trimester: Secondary | ICD-10-CM | POA: Diagnosis present

## 2021-02-04 DIAGNOSIS — A599 Trichomoniasis, unspecified: Secondary | ICD-10-CM | POA: Diagnosis not present

## 2021-02-04 DIAGNOSIS — N898 Other specified noninflammatory disorders of vagina: Secondary | ICD-10-CM | POA: Diagnosis not present

## 2021-02-04 DIAGNOSIS — F17210 Nicotine dependence, cigarettes, uncomplicated: Secondary | ICD-10-CM | POA: Diagnosis not present

## 2021-02-04 DIAGNOSIS — J45909 Unspecified asthma, uncomplicated: Secondary | ICD-10-CM | POA: Insufficient documentation

## 2021-02-04 DIAGNOSIS — Z3A01 Less than 8 weeks gestation of pregnancy: Secondary | ICD-10-CM | POA: Insufficient documentation

## 2021-02-04 LAB — HCG, QUANTITATIVE, PREGNANCY: hCG, Beta Chain, Quant, S: 18006 m[IU]/mL — ABNORMAL HIGH (ref <5)

## 2021-02-04 MED ORDER — METRONIDAZOLE 500 MG PO TABS
500.0000 mg | ORAL_TABLET | Freq: Once | ORAL | Status: AC
  Administered 2021-02-04: 500 mg via ORAL
  Filled 2021-02-04: qty 1

## 2021-02-04 NOTE — ED Notes (Signed)
Pt taken to US

## 2021-02-04 NOTE — ED Triage Notes (Signed)
Pt states she was diagnosed with an STD, and is unable to afford her medications. C/o itching and burning. Also states she was told she was approx. 5-[redacted] weeks pregnant.

## 2021-02-04 NOTE — ED Provider Notes (Signed)
[facility] EMERGENCY DEPARTMENT Provider Note   CSN: [id number] Arrival date & time: 02/04/21  1624     History Chief Complaint  Patient presents with   SEXUALLY TRANSMITTED DISEASE    Ashley Morris is a 25 y.o. female.  Patient is a 25 year old female G3, P2 who is presenting today with complaints of ongoing vaginal discharge, dysuria and lower pelvic pain.  Patient reports the pelvic pain started approximately 7 days ago and has persisted.  It does not radiate, aching in nature and nothing seems to make it better or worse.  She went to urgent care 4 days ago and at that time was told she was pregnant.  She was also swabbed and came back positive for trichomonas and bacterial vaginosis.  She tested negative for gonorrhea and chlamydia.  Since that time she has not been able to fill her antibiotic prescription because she has not been paid and cannot afford it.  She continues to have symptoms and is not getting any better.  Her last menses was 2 months ago as it had been irregular after having her Nexplanon removed.  She denies any nausea vomiting or fever.  She is currently sexually active with only one partner and does not use protection regularly.  The history is provided by the patient.      Past Medical History:  Diagnosis Date   Asthma    Urinary tract infection     Patient Active Problem List   Diagnosis Date Noted   Active labor at term 11/29/2014   Abdominal pain in pregnancy, antepartum    Non-reactive NST (non-stress test)    Noncompliant pregnant patient in third trimester, antepartum 10/07/2014   Decreased fetal movement affecting management of mother, antepartum 10/07/2014   Supervision of normal pregnancy in second trimester 06/17/2014   Axillary mass 06/17/2014    Past Surgical History:  Procedure Laterality Date   NO PAST SURGERIES       OB History     Gravida  4   Para  2   Term  2   Preterm      AB  1   Living  2       SAB  1   IAB      Ectopic      Multiple  0   Live Births  2           Family History  Problem Relation Age of Onset   Hypertension Mother    Anemia Mother     Social History   Tobacco Use   Smoking status: Every Day    Packs/day: 0.25    Types: Cigarettes   Smokeless tobacco: Never  Vaping Use   Vaping Use: Some days  Substance Use Topics   Alcohol use: Not Currently   Drug use: Yes    Types: Marijuana    Home Medications Prior to Admission medications   Medication Sig Start Date End Date Taking? Authorizing Provider  metroNIDAZOLE (FLAGYL) 500 MG tablet Take 1 tablet (500 mg total) by mouth 2 (two) times daily. 02/02/21   Lamptey, Britta Mccreedy, MD  naproxen (NAPROSYN) 500 MG tablet Take 1 tablet (500 mg total) by mouth 2 (two) times daily. 05/17/20   Hall-Potvin, [place], PA-C  valACYclovir (VALTREX) 1000 MG tablet Take 2 tablets (2,000 mg total) by mouth daily for 5 days. 02/03/21 02/08/21  Claiborne Rigg, NP    Allergies    Patient has no known allergies.  Review of  Systems   Review of Systems  All other systems reviewed and are negative.  Physical Exam Updated Vital Signs BP 123/72 (BP Location: Left Arm)   Pulse 86   Temp 99.4 F (37.4 C)   Resp 16   LMP 12/01/2020 (Approximate)   SpO2 100%   Physical Exam Vitals and nursing note reviewed.  Constitutional:      General: She is not in acute distress.    Appearance: Normal appearance. She is well-developed.  HENT:     Head: Normocephalic and atraumatic.     Nose: Nose normal.  Eyes:     Pupils: Pupils are equal, round, and reactive to light.  Cardiovascular:     Rate and Rhythm: Normal rate and regular rhythm.     Pulses: Normal pulses.     Heart sounds: Normal heart sounds. No murmur heard.   No friction rub.  Pulmonary:     Effort: Pulmonary effort is normal.     Breath sounds: Normal breath sounds. No wheezing or rales.  Abdominal:     General: Bowel sounds are normal. There is no  distension.     Palpations: Abdomen is soft.     Tenderness: There is abdominal tenderness in the suprapubic area. There is no right CVA tenderness, left CVA tenderness, guarding or rebound.  Musculoskeletal:        General: No tenderness. Normal range of motion.     Cervical back: Normal range of motion and neck supple.     Right lower leg: No edema.     Left lower leg: No edema.     Comments: No edema  Skin:    General: Skin is warm and dry.     Findings: No rash.  Neurological:     Mental Status: She is alert and oriented to person, place, and time. Mental status is at baseline.     Cranial Nerves: No cranial nerve deficit.  Psychiatric:        Mood and Affect: Mood normal.        Behavior: Behavior normal.    ED Results / Procedures / Treatments   Labs (all labs ordered are listed, but only abnormal results are displayed) Labs Reviewed  HCG, QUANTITATIVE, PREGNANCY - Abnormal; Notable for the following components:      Result Value   hCG, Beta Chain, Quant, S 18,006 (*)    All other components within normal limits    EKG None  Radiology US OB Comp < 14 Wks  Result Date: 02/04/2021 CLINICAL DATA:  Pelvic pain. EXAM: OBSTETRIC <14 WK ULTRASOUND TECHNIQUE: Transabdominal ultrasound was performed for evaluation of the gestation as well as the maternal uterus and adnexal regions. COMPARISON:  None. FINDINGS: Intrauterine gestational sac: Single Yolk sac:  Visualized. Embryo:  Not Visualized. MSD:  10.1 mm   5 w   5 d Subchorionic hemorrhage: There is a small subchorionic hemorrhage measuring 4 x 2 x 6 mm along the anterior inferior margin of the gestational sac Maternal uterus/adnexa: There is likely a corpus luteal cyst in the right ovary measuring 3.3 x 2.2 x 2.1 cm. Left ovary appears within normal limits. No free fluid. IMPRESSION: 1. Single intrauterine gestational sac measuring 5 weeks 5 days by mean gestational sac diameter. A normal yolk sac is visualized, but fetal pole is  not visualized at this time. Probable early intrauterine gestational sac, but no fetal pole or cardiac activity yet visualized. Recommend follow-up quantitative B-HCG levels and follow-up US in 14 days to  assess viability. This recommendation follows SRU consensus guidelines: Diagnostic Criteria for Nonviable Pregnancy Early in the First Trimester. Malva Limes Med 2013; 409:8119-14. 2. Small subchorionic hemorrhage. 3. Likely corpus luteum in the left ovary. Electronically Signed   By: Darliss Cheney M.D.   On: 02/04/2021 21:27   US OB Transvaginal  Result Date: 02/04/2021 CLINICAL DATA:  Pelvic pain. EXAM: OBSTETRIC <14 WK ULTRASOUND TECHNIQUE: Transabdominal ultrasound was performed for evaluation of the gestation as well as the maternal uterus and adnexal regions. COMPARISON:  None. FINDINGS: Intrauterine gestational sac: Single Yolk sac:  Visualized. Embryo:  Not Visualized. MSD:  10.1 mm   5 w   5 d Subchorionic hemorrhage: There is a small subchorionic hemorrhage measuring 4 x 2 x 6 mm along the anterior inferior margin of the gestational sac Maternal uterus/adnexa: There is likely a corpus luteal cyst in the right ovary measuring 3.3 x 2.2 x 2.1 cm. Left ovary appears within normal limits. No free fluid. IMPRESSION: 1. Single intrauterine gestational sac measuring 5 weeks 5 days by mean gestational sac diameter. A normal yolk sac is visualized, but fetal pole is not visualized at this time. Probable early intrauterine gestational sac, but no fetal pole or cardiac activity yet visualized. Recommend follow-up quantitative B-HCG levels and follow-up US in 14 days to assess viability. This recommendation follows SRU consensus guidelines: Diagnostic Criteria for Nonviable Pregnancy Early in the First Trimester. Malva Limes Med 2013; 782:9562-13. 2. Small subchorionic hemorrhage. 3. Likely corpus luteum in the left ovary. Electronically Signed   By: Darliss Cheney M.D.   On: 02/04/2021 21:27    Procedures Procedures    Medications Ordered in ED Medications  metroNIDAZOLE (FLAGYL) tablet 500 mg (500 mg Oral Given 02/04/21 1929)    ED Course  I have reviewed the triage vital signs and the nursing notes.  Pertinent labs & imaging results that were available during my care of the patient were reviewed by me and considered in my medical decision making (see chart for details).    MDM Rules/Calculators/A&P                           Patient is a 25 year old female presenting today with lower abdominal pain for the last 1 week.  She is currently pregnant with hCG that went from 5000-18,000 in the last 4 days.  Also tested positive for trichomonas 4 days ago and has not taken any medications because she cannot afford them currently.  She was negative for GC and chlamydia.  On exam she still has suprapubic pain and given the pain, pregnancy and known STI we will do an ultrasound to confirm IUP and rule out TOA or ectopic or other acute issue.  Patient given first dose of Flagyl here and will talk to transition of care team about providing patient's medication.  Patient had to be moved to a hallway bed because a level 1 trauma was coming in.  She became very upset by this.  She did not wait for her ultrasound results and left AMA.  Ultrasound results did return and patient appears to be 5 weeks 5 days pregnant with a intrauterine gestational sac but there is no fetal pole or yolk sac yet.  Patient will need follow-up hCGs and repeat imaging but most likely IUP.  She left before this was communicated with her.  She did get a prescription for Flagyl and had consulted transition of care team but there  is nobody on call today.  Hopefully someone will contact her and help her get her medication.  Unable to reach the patient when attempted to call her by phone.  MDM   Amount and/or Complexity of Data Reviewed Clinical lab tests: ordered and reviewed Tests in the radiology section of CPT: ordered and reviewed Independent  visualization of images, tracings, or specimens: yes     Final Clinical Impression(s) / ED Diagnoses Final diagnoses:  Trichomonas infection  First trimester pregnancy    Rx / DC Orders ED Discharge Orders     None        Gwyneth Sprout, MD 02/04/21 2328

## 2021-02-04 NOTE — ED Provider Notes (Signed)
Emergency Medicine Provider Triage Evaluation Note  Ashley Morris , a 25 y.o. female  was evaluated in triage.  Pt complains of STI.  Patient went to urgent care yesterday for vaginal itching and RLQ abdominal pain where she was diagnosed with Trichomonas and BV. She also had an elevated HcG but a negative pregnancy test. She states that she is unable to afford the treatment of the STI and is also confused on whether she is pregnant or not, given the discrepancy of the two tests. She is no longer having the RLQ abdominal pain.   Review of Systems  Positive: Vaginal itching Negative: Vaginal discharge, abdominal pain, n/v, chest pain, dyspnea  Physical Exam  BP 123/72 (BP Location: Left Arm)   Pulse 86   Temp 99.4 F (37.4 C)   Resp 16   LMP 12/01/2020 (Approximate)   SpO2 100%  Gen:   Awake, no distress   Resp:  Normal effort  MSK:   Moves extremities without difficulty  Other:    Medical Decision Making  Medically screening exam initiated at 5:03 PM.  Appropriate orders placed.  Ashley Morris was informed that the remainder of the evaluation will be completed by another provider, this initial triage assessment does not replace that evaluation, and the importance of remaining in the ED until their evaluation is complete.  1708: spoke to MAU admission attending regarding this case. They will not accept patient for admission at this time. They recommend getting $4 Flagyl from [facility] and going to any health department for pregnancy confirmation.   I placed HcG level for trending to make sure it is going up since yesterday.       Claudie Leach, PA-C 02/04/21 1712    Franne Forts, DO 02/05/21 1125

## 2021-02-04 NOTE — ED Notes (Signed)
Pt states that she is not staying and upset that she was pulled into the hallway, pt states that she will get her results on mychart, unable to get patient to stay

## 2021-02-05 ENCOUNTER — Telehealth: Payer: Self-pay

## 2021-02-05 NOTE — Telephone Encounter (Signed)
Attempted to call patient on listed phone, patient signed out AMA last night She does have a prescription for flagyl, can offer [facility], however  the phone when called states person is unreachable.

## 2021-02-09 ENCOUNTER — Encounter: Payer: Self-pay | Admitting: Nurse Practitioner

## 2021-02-15 ENCOUNTER — Other Ambulatory Visit: Payer: Self-pay | Admitting: *Deleted

## 2021-02-15 DIAGNOSIS — O3680X0 Pregnancy with inconclusive fetal viability, not applicable or unspecified: Secondary | ICD-10-CM

## 2021-02-22 ENCOUNTER — Ambulatory Visit (INDEPENDENT_AMBULATORY_CARE_PROVIDER_SITE_OTHER): Payer: Self-pay

## 2021-02-22 ENCOUNTER — Other Ambulatory Visit: Payer: Self-pay

## 2021-02-22 ENCOUNTER — Ambulatory Visit
Admission: RE | Admit: 2021-02-22 | Discharge: 2021-02-22 | Disposition: A | Discharge status: Home / Self Care | Payer: Medicaid Other | Source: Ambulatory Visit | Attending: Obstetrics and Gynecology | Admitting: Obstetrics and Gynecology

## 2021-02-22 DIAGNOSIS — O3680X0 Pregnancy with inconclusive fetal viability, not applicable or unspecified: Secondary | ICD-10-CM | POA: Diagnosis not present

## 2021-02-22 DIAGNOSIS — Z3491 Encounter for supervision of normal pregnancy, unspecified, first trimester: Secondary | ICD-10-CM

## 2021-02-23 NOTE — Progress Notes (Signed)
Late entry-  Pt brought down from MFM for U/S results.  U/S showed single IUP with small subchronic hemorrhage.  Pt informed results.  Pt denies vaginal bleeding and pain.  Pt advised that if she has any increased pain or starts having vaginal bleeding like a period to please to go to MAU.  Pt provided with U/S pic and advised to contact an OB provider to start prenatal care.  Pt verbalized understanding with no further questions.    Zamia Tyminski,RN  02/22/21

## 2021-02-23 NOTE — Progress Notes (Signed)
Patient seen and assessed by nursing staff.  Agree with documentation and plan.  

## 2021-03-16 LAB — OB RESULTS CONSOLE HGB/HCT, BLOOD
HCT: 41 (ref 29–41)
Hemoglobin: 13.7

## 2021-03-16 LAB — OB RESULTS CONSOLE VARICELLA ZOSTER ANTIBODY, IGG: Varicella: NON-IMMUNE/NOT IMMUNE

## 2021-03-16 LAB — OB RESULTS CONSOLE HEPATITIS B SURFACE ANTIGEN: Hepatitis B Surface Ag: NEGATIVE

## 2021-03-16 LAB — OB RESULTS CONSOLE RPR: RPR: NONREACTIVE

## 2021-03-16 LAB — OB RESULTS CONSOLE PLATELET COUNT: Platelets: 259

## 2021-03-16 LAB — OB RESULTS CONSOLE HIV ANTIBODY (ROUTINE TESTING): HIV: NONREACTIVE

## 2021-03-16 LAB — SICKLE CELL SCREEN: Sickle Cell Screen: NORMAL

## 2021-03-16 LAB — OB RESULTS CONSOLE GC/CHLAMYDIA
Chlamydia: NEGATIVE
Gonorrhea: NEGATIVE

## 2021-03-16 LAB — OB RESULTS CONSOLE RUBELLA ANTIBODY, IGM: Rubella: IMMUNE

## 2021-03-16 LAB — HEPATITIS C ANTIBODY: HCV Ab: NEGATIVE

## 2021-03-16 LAB — OB RESULTS CONSOLE ABO/RH: RH Type: POSITIVE

## 2021-03-16 LAB — CYTOLOGY - PAP: Pap: NEGATIVE

## 2021-03-16 LAB — OB RESULTS CONSOLE ANTIBODY SCREEN: Antibody Screen: NEGATIVE

## 2021-04-30 ENCOUNTER — Encounter (HOSPITAL_COMMUNITY): Payer: Self-pay | Admitting: Obstetrics and Gynecology

## 2021-04-30 ENCOUNTER — Inpatient Hospital Stay (HOSPITAL_COMMUNITY)
Admission: AD | Admit: 2021-04-30 | Discharge: 2021-04-30 | Disposition: A | Discharge status: Home / Self Care | Payer: Medicaid Other | Attending: Obstetrics and Gynecology | Admitting: Obstetrics and Gynecology

## 2021-04-30 ENCOUNTER — Inpatient Hospital Stay (HOSPITAL_BASED_OUTPATIENT_CLINIC_OR_DEPARTMENT_OTHER): Payer: Medicaid Other

## 2021-04-30 DIAGNOSIS — O26892 Other specified pregnancy related conditions, second trimester: Secondary | ICD-10-CM | POA: Insufficient documentation

## 2021-04-30 DIAGNOSIS — O09292 Supervision of pregnancy with other poor reproductive or obstetric history, second trimester: Secondary | ICD-10-CM | POA: Diagnosis not present

## 2021-04-30 DIAGNOSIS — F17210 Nicotine dependence, cigarettes, uncomplicated: Secondary | ICD-10-CM | POA: Insufficient documentation

## 2021-04-30 DIAGNOSIS — Z3A17 17 weeks gestation of pregnancy: Secondary | ICD-10-CM | POA: Insufficient documentation

## 2021-04-30 DIAGNOSIS — M7918 Myalgia, other site: Secondary | ICD-10-CM | POA: Insufficient documentation

## 2021-04-30 DIAGNOSIS — O23592 Infection of other part of genital tract in pregnancy, second trimester: Secondary | ICD-10-CM | POA: Insufficient documentation

## 2021-04-30 DIAGNOSIS — B9689 Other specified bacterial agents as the cause of diseases classified elsewhere: Secondary | ICD-10-CM | POA: Diagnosis not present

## 2021-04-30 DIAGNOSIS — O4692 Antepartum hemorrhage, unspecified, second trimester: Secondary | ICD-10-CM

## 2021-04-30 DIAGNOSIS — M545 Low back pain, unspecified: Secondary | ICD-10-CM | POA: Insufficient documentation

## 2021-04-30 DIAGNOSIS — O26852 Spotting complicating pregnancy, second trimester: Secondary | ICD-10-CM | POA: Insufficient documentation

## 2021-04-30 DIAGNOSIS — O99332 Smoking (tobacco) complicating pregnancy, second trimester: Secondary | ICD-10-CM | POA: Insufficient documentation

## 2021-04-30 DIAGNOSIS — O0932 Supervision of pregnancy with insufficient antenatal care, second trimester: Secondary | ICD-10-CM | POA: Diagnosis not present

## 2021-04-30 DIAGNOSIS — O26859 Spotting complicating pregnancy, unspecified trimester: Secondary | ICD-10-CM

## 2021-04-30 HISTORY — DX: Urinary tract infection, site not specified: N39.0

## 2021-04-30 LAB — URINALYSIS, ROUTINE W REFLEX MICROSCOPIC
Bilirubin Urine: NEGATIVE
Glucose, UA: NEGATIVE mg/dL
Hgb urine dipstick: NEGATIVE
Ketones, ur: NEGATIVE mg/dL
Leukocytes,Ua: NEGATIVE
Nitrite: NEGATIVE
Protein, ur: NEGATIVE mg/dL
Specific Gravity, Urine: 1.017 (ref 1.005–1.030)
pH: 7 (ref 5.0–8.0)

## 2021-04-30 LAB — CBC
HCT: 35.5 % — ABNORMAL LOW (ref 36.0–46.0)
Hemoglobin: 12 g/dL (ref 12.0–15.0)
MCH: 31.2 pg (ref 26.0–34.0)
MCHC: 33.8 g/dL (ref 30.0–36.0)
MCV: 92.2 fL (ref 80.0–100.0)
Platelets: 233 10*3/uL (ref 150–400)
RBC: 3.85 MIL/uL — ABNORMAL LOW (ref 3.87–5.11)
RDW: 13.5 % (ref 11.5–15.5)
WBC: 8 10*3/uL (ref 4.0–10.5)
nRBC: 0 % (ref 0.0–0.2)

## 2021-04-30 LAB — WET PREP, GENITAL
Sperm: NONE SEEN
Trich, Wet Prep: NONE SEEN
WBC, Wet Prep HPF POC: >=10 — AB (ref <10)
Yeast Wet Prep HPF POC: NONE SEEN

## 2021-04-30 MED ORDER — PREPLUS 27-1 MG PO TABS: 1.0000 | ORAL_TABLET | Freq: Every day | ORAL | 13 refills | Status: DC

## 2021-04-30 MED ORDER — METRONIDAZOLE 500 MG PO TABS: 500.0000 mg | ORAL_TABLET | Freq: Two times a day (BID) | ORAL | 0 refills | Status: DC

## 2021-04-30 MED ORDER — ACETAMINOPHEN 500 MG PO TABS
1000.0000 mg | ORAL_TABLET | Freq: Once | ORAL | Status: AC
  Administered 2021-04-30: 1000 mg via ORAL
  Filled 2021-04-30: qty 2

## 2021-04-30 NOTE — Discharge Instructions (Signed)

## 2021-04-30 NOTE — MAU Provider Note (Signed)
History     CSN: [id number]  Arrival date and time: 04/30/21 [id number]   Event Date/Time   First Provider Initiated Contact with Patient 04/30/21 1031      Chief Complaint  Patient presents with   Vaginal Bleeding   Back Pain   HPI Ashley Morris is a 25 y.o. .[id number] at [redacted]w[redacted]d by 8 week [place]. She presents to MAU with chief complaints of vaginal spotting, abdominal pain and back pain. These are new complaints, onset first thing this morning. Patient states she was temporarily homeless and unable to continue attending prenatal appointments. She is now in a "maternity home" and wanting to make sure everything is ok with the baby.  Vaginal Spotting Patient reports seeing scant spotting when she wiped after voiding this morning. She denies frank bleeding. She has not needed to don a pad. She denies dizziness, weakness, syncope.  Abdominal Pain Patient's pain is suprapubic, does not radiate. Pain score is 8/10. She denies aggravating factors. She has not taken medication or tried other treatments for this complaint. She denies dysuria, hematuria, abdominal tenderness, fever.  Back Pain Pain is located in her middle lower back at the margin of her SI joint. Pain score 6/10. She denies aggravating factors. She has not taken medication or tried other treatments for this complaint.  OB History     Gravida  4   Para  2   Term  2   Preterm      AB  1   Living  2      SAB  1   IAB      Ectopic      Multiple  0   Live Births  2           Past Medical History:  Diagnosis Date   Asthma    Urinary tract infection    UTI (urinary tract infection)     Past Surgical History:  Procedure Laterality Date   NO PAST SURGERIES      Family History  Problem Relation Age of Onset   Hypertension Mother    Anemia Mother     Social History   Tobacco Use   Smoking status: Some Days    Years: 8.00    Types: Cigarettes   Smokeless tobacco: Never  Vaping Use   Vaping Use:  Never used  Substance Use Topics   Alcohol use: Not Currently   Drug use: Not Currently    Types: Marijuana    Allergies: No Known Allergies  Medications Prior to Admission  Medication Sig Dispense Refill Last Dose   metroNIDAZOLE (FLAGYL) 500 MG tablet Take 1 tablet (500 mg total) by mouth 2 (two) times daily. 14 tablet 0    naproxen (NAPROSYN) 500 MG tablet Take 1 tablet (500 mg total) by mouth 2 (two) times daily. 30 tablet 0     Review of Systems  Gastrointestinal:  Positive for abdominal pain.  Genitourinary:  Positive for vaginal bleeding.  Musculoskeletal:  Positive for back pain.  All other systems reviewed and are negative. Physical Exam   Blood pressure 112/65, pulse 69, temperature 98.7 F (37.1 C), temperature source Oral, resp. rate 16, last menstrual period 12/01/2020, SpO2 100 %.  Physical Exam Vitals and nursing note reviewed. Exam conducted with a chaperone present.  Constitutional:      Appearance: Normal appearance. She is not ill-appearing.  Cardiovascular:     Rate and Rhythm: Normal rate and regular rhythm.     Pulses: Normal pulses.  Heart sounds: Normal heart sounds.  Pulmonary:     Effort: Pulmonary effort is normal.     Breath sounds: Normal breath sounds.  Abdominal:     Tenderness: There is no abdominal tenderness.  Skin:    Capillary Refill: Capillary refill takes less than 2 seconds.  Neurological:     Mental Status: She is alert and oriented to person, place, and time.  Psychiatric:        Mood and Affect: Mood normal.        Behavior: Behavior normal.        Thought Content: Thought content normal.        Judgment: Judgment normal.    MAU Course/MDM  Procedures  --EMR reviewed. Dating by 8 week [place].   Patient Vitals for the past 24 hrs:  BP Temp Temp src Pulse Resp SpO2  04/30/21 1056 111/66 -- -- 82 -- --  04/30/21 0820 -- -- -- -- -- 100 %  04/30/21 0819 112/65 98.7 F (37.1 C) Oral 69 16 --   Orders Placed This  Encounter  Procedures   Wet prep, genital   [place] MFM OB Limited   Urinalysis, Routine w reflex microscopic Urine, Clean Catch   CBC   Nursing communication   Discharge patient   Results for orders placed or performed during the hospital encounter of 04/30/21 (from the past 24 hour(s))  Urinalysis, Routine w reflex microscopic Urine, Clean Catch     Status: Abnormal   Collection Time: 04/30/21  8:30 AM  Result Value Ref Range   Color, Urine YELLOW YELLOW   APPearance HAZY (A) CLEAR   Specific Gravity, Urine 1.017 1.005 - 1.030   pH 7.0 5.0 - 8.0   Glucose, UA NEGATIVE NEGATIVE mg/dL   Hgb urine dipstick NEGATIVE NEGATIVE   Bilirubin Urine NEGATIVE NEGATIVE   Ketones, ur NEGATIVE NEGATIVE mg/dL   Protein, ur NEGATIVE NEGATIVE mg/dL   Nitrite NEGATIVE NEGATIVE   Leukocytes,Ua NEGATIVE NEGATIVE  CBC     Status: Abnormal   Collection Time: 04/30/21  9:27 AM  Result Value Ref Range   WBC 8.0 4.0 - 10.5 K/uL   RBC 3.85 (L) 3.87 - 5.11 MIL/uL   Hemoglobin 12.0 12.0 - 15.0 g/dL   HCT 33.0 (L) 07.6 - 22.6 %   MCV 92.2 80.0 - 100.0 fL   MCH 31.2 26.0 - 34.0 pg   MCHC 33.8 30.0 - 36.0 g/dL   RDW 33.3 54.5 - 62.5 %   Platelets 233 150 - 400 K/uL   nRBC 0.0 0.0 - 0.2 %  Wet prep, genital     Status: Abnormal   Collection Time: 04/30/21  9:36 AM   Specimen: Vaginal  Result Value Ref Range   Yeast Wet Prep HPF POC NONE SEEN NONE SEEN   Trich, Wet Prep NONE SEEN NONE SEEN   Clue Cells Wet Prep HPF POC PRESENT (A) NONE SEEN   WBC, Wet Prep HPF POC >=10 (A) <10   Sperm NONE SEEN    Meds ordered this encounter  Medications   acetaminophen (TYLENOL) tablet 1,000 mg   Prenatal Vit-Fe Fumarate-FA (PREPLUS) 27-1 MG TABS    Sig: Take 1 tablet by mouth daily.    Dispense:  30 tablet    Refill:  13    Order Specific Question:   Supervising Provider    Answer:   Alysia Penna, MICHAEL L [[id number]]   metroNIDAZOLE (FLAGYL) 500 MG tablet    Sig: Take 1 tablet (500 mg total)  by mouth 2 (two) times daily.     Dispense:  14 tablet    Refill:  0    Order Specific Question:   Supervising Provider    Answer:   Arlina Robes L [[id number]]   Assessment and Plan  --25 y.o. [id number] at [redacted]w[redacted]d  --[place] 150 by Doppler --No acute findings on MFM Limited scan --Bacterial Vaginosis --Patient now has secure housing, prenatal appointments in work --Discharge home in stable condition  Darlina Rumpf, [place] 04/30/2021, 3:51 PM

## 2021-04-30 NOTE — MAU Note (Signed)
Woke up to get ready for work, saw some light bleeding on tissue when wiped. Having some cramping in lower abd.  Has not been to a dr in over 4 wks, was 13wk at that time.  Moved recently . Also having a little pain in her low back.

## 2021-05-01 LAB — GC/CHLAMYDIA PROBE AMP (~~LOC~~) NOT AT ARMC
Chlamydia: NEGATIVE
Comment: NEGATIVE
Comment: NORMAL
Neisseria Gonorrhea: NEGATIVE

## 2021-05-21 NOTE — L&D Delivery Note (Signed)
Delivery Note ?Pt progressed to ant lip/+1 station w/urge to push. AROM w/clear fluid, ant lip reduced. After 2 more contracitons, at 7:53 PM a viable female was delivered via Vaginal, Spontaneous (Presentation: Left Occiput Anterior, nuchal X2, reduced).  APGAR:8/9 , ; weight pending. After 1 minute, the cord was clamped and cut. 40 units of pitocin diluted in 1000cc LR was infused rapidly IV.  The placenta separated spontaneously and delivered via CCT and maternal pushing effort.  It was inspected and appears to be intact with a 3 VC.  ? ?Anesthesia:  none ?Episiotomy:  none ?Lacerations:  none ?Suture Repair:  ?Est. Blood Loss (mL):  400 ? ?Mom to postpartum.  Baby to Couplet care / Skin to Skin. ? ?Jacklyn Shell ?09/28/2021, 8:07 PM ? ? ? ?

## 2021-05-31 ENCOUNTER — Emergency Department (HOSPITAL_COMMUNITY)
Admission: EM | Admit: 2021-05-31 | Discharge: 2021-06-01 | Disposition: A | Discharge status: Home / Self Care | Payer: Medicaid Other | Attending: Emergency Medicine | Admitting: Emergency Medicine

## 2021-05-31 ENCOUNTER — Emergency Department (HOSPITAL_COMMUNITY): Payer: Medicaid Other

## 2021-05-31 ENCOUNTER — Other Ambulatory Visit: Payer: Self-pay

## 2021-05-31 DIAGNOSIS — R0602 Shortness of breath: Secondary | ICD-10-CM | POA: Diagnosis present

## 2021-05-31 DIAGNOSIS — U071 COVID-19: Secondary | ICD-10-CM | POA: Diagnosis not present

## 2021-05-31 DIAGNOSIS — J069 Acute upper respiratory infection, unspecified: Secondary | ICD-10-CM | POA: Diagnosis not present

## 2021-05-31 MED ORDER — DEXTROMETHORPHAN POLISTIREX ER 30 MG/5ML PO SUER
30.0000 mg | Freq: Once | ORAL | Status: AC
  Administered 2021-06-01: 30 mg via ORAL
  Filled 2021-05-31: qty 5

## 2021-05-31 MED ORDER — DEXTROMETHORPHAN POLISTIREX ER 30 MG/5ML PO SUER
30.0000 mg | Freq: Once | ORAL | Status: DC
  Filled 2021-05-31: qty 5

## 2021-05-31 MED ORDER — ACETAMINOPHEN 325 MG PO TABS
650.0000 mg | ORAL_TABLET | Freq: Once | ORAL | Status: AC
  Administered 2021-06-01: 650 mg via ORAL
  Filled 2021-05-31: qty 2

## 2021-05-31 NOTE — ED Triage Notes (Signed)
BIB [facility]. SOB and cough starting today. No HX asthma. 30mo preg, 3rd pregnancy, 2 living children, no complications. No fever, no one sick in household.

## 2021-05-31 NOTE — ED Provider Notes (Addendum)
[facility] EMERGENCY DEPARTMENT Provider Note   CSN: [id number] Arrival date & time: 05/31/21  2115     History  Chief Complaint  Patient presents with   Shortness of Breath    Ashley Morris is a 26 y.o. female.  Patient with no pertinent past medical history presents today with chief complaint of cough and congestion.  She states that she developed cough and congestion earlier today and a few hours later developed chest pain that is only present when she coughs and is sharp in nature in the center of her chest.  Of note, patient is 5 months pregnant.  She is G3P2.  She denies fevers or chills.  States that no one in her household is sick.  Cough is dry and nonproductive.  Denies any leg pain or swelling, recent travel, recent surgeries, history of malignancy, or history of blood clots.   Shortness of Breath Associated symptoms: cough   Associated symptoms: no fever, no rash and no vomiting       Home Medications Prior to Admission medications   Medication Sig Start Date End Date Taking? Authorizing Provider  metroNIDAZOLE (FLAGYL) 500 MG tablet Take 1 tablet (500 mg total) by mouth 2 (two) times daily. 04/30/21   Calvert Cantor, CNM  Prenatal Vit-Fe Fumarate-FA (PREPLUS) 27-1 MG TABS Take 1 tablet by mouth daily. 04/30/21   Calvert Cantor, CNM      Allergies    Patient has no known allergies.    Review of Systems   Review of Systems  Constitutional:  Negative for chills and fever.  HENT:  Positive for congestion and rhinorrhea.   Respiratory:  Positive for cough and shortness of breath.   Gastrointestinal:  Negative for diarrhea, nausea and vomiting.  Genitourinary:  Negative for vaginal bleeding, vaginal discharge and vaginal pain.  Skin:  Negative for rash.  All other systems reviewed and are negative.  Physical Exam Updated Vital Signs BP 122/76    Pulse 84    Temp 98.2 F (36.8 C) (Oral)    Resp 16    LMP 12/01/2020 (Approximate)     SpO2 99%  Physical Exam Vitals and nursing note reviewed.  Constitutional:      Appearance: She is well-developed.     Interventions: She is not intubated.    Comments: Patient resting comfortably in bed with blanket over her head lying flat in no acute distress  HENT:     Head: Normocephalic and atraumatic.     Mouth/Throat:     Mouth: Mucous membranes are moist.  Eyes:     Extraocular Movements: Extraocular movements intact.     Pupils: Pupils are equal, round, and reactive to light.  Cardiovascular:     Rate and Rhythm: Normal rate and regular rhythm.     Heart sounds: Normal heart sounds.  Pulmonary:     Effort: Pulmonary effort is normal. No tachypnea, bradypnea, accessory muscle usage or respiratory distress. She is not intubated.     Breath sounds: Normal breath sounds. No stridor. No decreased breath sounds, wheezing, rhonchi or rales.  Chest:     Chest wall: No tenderness.  Abdominal:     Comments: Gravid abdomen  Musculoskeletal:     Cervical back: Normal range of motion.     Right lower leg: No tenderness. No edema.     Left lower leg: No tenderness. No edema.  Skin:    General: Skin is warm and dry.  Neurological:  General: No focal deficit present.     Mental Status: She is alert.  Psychiatric:        Mood and Affect: Mood normal.        Behavior: Behavior normal.    ED Results / Procedures / Treatments   Labs (all labs ordered are listed, but only abnormal results are displayed) Labs Reviewed  RESP PANEL BY RT-PCR (FLU A&B, COVID) [id number]    EKG None  Radiology DG Chest 2 View  Result Date: 05/31/2021 CLINICAL DATA:  Shortness of breath EXAM: CHEST - 2 VIEW COMPARISON:  09/19/2019 FINDINGS: Heart and mediastinal contours are within normal limits. No focal opacities or effusions. No acute bony abnormality. IMPRESSION: No active cardiopulmonary disease. Electronically Signed   By: Charlett NoseKevin  Dover M.D.   On: 05/31/2021 23:37    Procedures Procedures     Medications Ordered in ED Medications  dextromethorphan (DELSYM) 30 MG/5ML liquid 30 mg (has no administration in time range)  acetaminophen (TYLENOL) tablet 650 mg (has no administration in time range)    ED Course/ Medical Decision Making/ A&P                           Medical Decision Making  Patient presents today with URI symptoms including cough and congestion with chest pain that is only present when she coughs. Patient is also 5 months pregnant.   Co morbidities that complicate the patient evaluation  Pregnancy   Lab Tests:  I Ordered, and personally interpreted labs.  The pertinent results include:  COVID and flu swab, positive for COVID.   Imaging Studies ordered:  I ordered imaging studies including CXR  I independently visualized and interpreted imaging which showed no active cardiopulmonary disease I agree with the radiologist interpretation   Cardiac Monitoring:  The patient was maintained on a cardiac monitor.  I personally viewed and interpreted the cardiac monitored which showed an underlying rhythm of: NSR   Medicines ordered and prescription drug management:  I ordered medication including Delsym for cough and Tylenol for pain  Reevaluation of the patient after these medicines showed that the patient improved I have reviewed the patients home medicines and have made adjustments as needed   Test Considered:  Considered PE study, however patient is not tachypneic, tachycardic, or febrile. She does not have leg pain or PE risk factors other than pregnancy. She is not short of breath. Her CXR is without abnormality and EKG is without evidence of PE including S1Q3T3, or evidence of right heart strain. She is satting 99% on room air and is in no acute distress. Therefore feel that radiation exposure risk to fetus outweighs PE risk to patient in the setting known abnormalities associated with d dimers in pregnancy. Discussed this with patient who  agrees.    Dispostion:  After consideration of the diagnostic results and the patients response to treatment, I feel that the patent would benefit from outpatient management of symptoms. Feel that given normal work-up in a young healthy female without comorbidities who is afebrile, nontoxic-appearing, and in no acute distress with reassuring vital signs who's chest pain is only present when she coughs, patient's symptoms are due to COVID with chest irritation from associated coughing. Pt CXR negative for acute infiltrate. EKG without signs of PE or ACS. Discussed that antibiotics are not indicated for viral infections. Pt will be discharged with symptomatic treatment.  I have also informed her to call her OB/GYN to schedule an  appointment for further management in the setting of COVID-positive pregnant patient. She verbalizes understanding and is agreeable with plan, plans to call her OB tomorrow to schedule an appointment. Pt is hemodynamically stable & in NAD prior to dc. Educated on red flag symptoms that would prompt immediate return. Discharged in stable condition.   Findings and plan of care discussed with supervising physician Dr. Durwin Nora who is in agreement.     Final Clinical Impression(s) / ED Diagnoses Final diagnoses:  Viral upper respiratory tract infection  COVID    Rx / DC Orders ED Discharge Orders     None     An After Visit Summary was printed and given to the patient.     Silva Bandy, PA-C 06/01/21 0012    Silva Bandy, PA-C 06/01/21 0024    Gloris Manchester, MD 06/04/21 [phone]

## 2021-05-31 NOTE — ED Notes (Signed)
Patient transported to X-ray 

## 2021-06-01 LAB — RESP PANEL BY RT-PCR (FLU A&B, COVID) ARPGX2
Influenza A by PCR: NEGATIVE
Influenza B by PCR: NEGATIVE
SARS Coronavirus 2 by RT PCR: POSITIVE — AB

## 2021-06-01 NOTE — ED Notes (Signed)
This RN asks PA to speak with pt before discharge to discuss COVID finding.

## 2021-06-01 NOTE — Discharge Instructions (Signed)
Your work-up in the ER this evening was unremarkable for acute findings.  Feel that your symptoms are likely due to an upper respiratory infection.  These are almost always viral in nature, therefore antibiotics are not indicated.  Please manage with supportive care including Tylenol for pain.  You may also use over-the-counter cold medication if it states on the bottle that it is safe in pregnancy.  Follow-up with OB/GYN for continued evaluation and management.  Return if development of any new or worsening symptoms.

## 2021-06-01 NOTE — ED Notes (Signed)
Pt verbalized understanding of d/c instructions, meds, and followup care. Denies questions. VSS, no distress noted. Steady gait to exit with all belongings.  ?

## 2021-06-06 ENCOUNTER — Other Ambulatory Visit: Payer: Self-pay

## 2021-06-06 ENCOUNTER — Telehealth: Payer: Medicaid Other

## 2021-06-08 ENCOUNTER — Telehealth (INDEPENDENT_AMBULATORY_CARE_PROVIDER_SITE_OTHER): Payer: Medicaid Other

## 2021-06-08 DIAGNOSIS — O0930 Supervision of pregnancy with insufficient antenatal care, unspecified trimester: Secondary | ICD-10-CM | POA: Insufficient documentation

## 2021-06-08 DIAGNOSIS — O09899 Supervision of other high risk pregnancies, unspecified trimester: Secondary | ICD-10-CM

## 2021-06-08 DIAGNOSIS — Z3A00 Weeks of gestation of pregnancy not specified: Secondary | ICD-10-CM

## 2021-06-08 DIAGNOSIS — O0990 Supervision of high risk pregnancy, unspecified, unspecified trimester: Secondary | ICD-10-CM | POA: Insufficient documentation

## 2021-06-08 MED ORDER — GOJJI WEIGHT SCALE MISC: 1.0000 | Freq: Once | 0 refills | Status: AC

## 2021-06-08 MED ORDER — BLOOD PRESSURE KIT DEVI: 1.0000 | Freq: Once | 0 refills | Status: AC

## 2021-06-08 NOTE — Progress Notes (Signed)
New OB Intake  I connected with  Ashley Morris on 06/08/21 at  8:15 AM EST by MyChart Video Visit and verified that I am speaking with the correct person using two identifiers. Nurse is located at [facility] and pt is located at home.  I discussed the limitations, risks, security and privacy concerns of performing an evaluation and management service by telephone and the availability of in person appointments. I also discussed with the patient that there may be a patient responsible charge related to this service. The patient expressed understanding and agreed to proceed.  I explained I am completing New OB Intake today. We discussed her EDD of 10/04/21 that is based on [place] at 8w. Pt is G3/P2002. I reviewed her allergies, medications, and OB history. Based on history, this is a high risk pregnancy due to limited prenatal care.  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 Pt works at [employer]. States she takes Tylenol as needed for pain, reassured patient this is safe for pregnancy. Explained dizziness can be a sign of dehydration or elevated BP. Reviewed when to check BP at home with patient. BP cuff ordered today.  Patient reports possible HSV outbreak on leg during pregnancy that has now healed. Appears now as a dark spot. Pt confirms this is not an open lesion. Reviewed HSV suppression at 36 weeks or before if provider recommends.  Delivery Plans:  Plans to deliver at [facility].   MyChart/Babyscripts MyChart access verified. I explained pt will have some visits in office and some virtually. Babyscripts instructions given and order placed.  Blood Pressure Cuff  Blood pressure cuff ordered for patient to pick-up from [employer]. Explained after first prenatal appt pt will check  weekly and document in Babyscripts.  Weight scale: Patient does not have weight scale. Weight scale ordered for patient to pick up from [employer].   Anatomy US Patient declines scheduling this today. States her work schedule is given weekly and her manager is not accommodating. Offered for patient to call MFM to schedule herself. States she would like to do this at new OB appt.  Covid Vaccine Patient states she does not believe in vaccines. Reports completing all childhood vaccines due to public school requirement.  CenteringPregnancy Candidate?  No  Mother/ Baby Dyad Candidate?    No  Unable to complete remainder of visit. Pt states she needs to leave for work. Chart routed to correct provider.  Marjo Bicker, RN 06/08/2021  8:51 AM

## 2021-06-08 NOTE — Progress Notes (Signed)
Patient seen and assessed by nursing staff.  Agree with documentation and plan.  

## 2021-06-09 DIAGNOSIS — Z2839 Other underimmunization status: Secondary | ICD-10-CM | POA: Insufficient documentation

## 2021-06-09 DIAGNOSIS — O0990 Supervision of high risk pregnancy, unspecified, unspecified trimester: Secondary | ICD-10-CM

## 2021-06-09 DIAGNOSIS — O09899 Supervision of other high risk pregnancies, unspecified trimester: Secondary | ICD-10-CM | POA: Insufficient documentation

## 2021-06-15 ENCOUNTER — Encounter: Payer: Medicaid Other | Admitting: Family Medicine

## 2021-07-04 ENCOUNTER — Encounter: Payer: Medicaid Other | Admitting: Nurse Practitioner

## 2021-07-18 ENCOUNTER — Other Ambulatory Visit: Payer: Self-pay

## 2021-07-18 ENCOUNTER — Ambulatory Visit (INDEPENDENT_AMBULATORY_CARE_PROVIDER_SITE_OTHER): Payer: Medicaid Other | Admitting: Nurse Practitioner

## 2021-07-18 VITALS — BP 112/66 | HR 84 | Wt 181.4 lb

## 2021-07-18 DIAGNOSIS — O0933 Supervision of pregnancy with insufficient antenatal care, third trimester: Secondary | ICD-10-CM

## 2021-07-18 DIAGNOSIS — R2231 Localized swelling, mass and lump, right upper limb: Secondary | ICD-10-CM | POA: Diagnosis not present

## 2021-07-18 DIAGNOSIS — O0990 Supervision of high risk pregnancy, unspecified, unspecified trimester: Secondary | ICD-10-CM | POA: Diagnosis not present

## 2021-07-18 NOTE — Progress Notes (Signed)
° ° °  Subjective:  Ashley Morris is a 26 y.o. G3P2002 at [redacted]w[redacted]d being seen today for NOB visit to this practice.  Initial visits in prenatal care were in [place].  Needs anatomy US and glucola.  She is currently monitored for the following issues for this high-risk pregnancy and has Axillary mass; Supervision of high risk pregnancy, antepartum; Limited prenatal care; and Susceptible to varicella (non-immune), currently pregnant on their problem list.  Patient reports no complaints.  Contractions: Not present. Vag. Bleeding: None.  Movement: Present. Denies leaking of fluid.   The following portions of the patient's history were reviewed and updated as appropriate: allergies, current medications, past family history, past medical history, past social history, past surgical history and problem list. Problem list updated.  Objective:   Vitals:   07/18/21 0857  BP: 112/66  Pulse: 84  Weight: 181 lb 6.4 oz (82.3 kg)    Fetal Status: Fetal Heart Rate (bpm): 135 Fundal Height: 31 cm Movement: Present     General:  Alert, oriented and cooperative. Patient is in no acute distress.  Skin: Skin is warm and dry. No rash noted.   Cardiovascular: Normal heart rate noted  Respiratory: Normal respiratory effort, no problems with respiration noted  Abdomen: Soft, gravid, appropriate for gestational age. Pain/Pressure: Present     Pelvic:  Cervical exam deferred        Extremities: Normal range of motion.  Edema: None  Mental Status: Normal mood and affect. Normal behavior. Normal judgment and thought content.   Urinalysis:      Assessment and Plan:  Pregnancy: G3P2002 at [redacted]w[redacted]d  1. Supervision of high risk pregnancy, antepartum Had prenatal care initially in [place], then moved back to [place].  Has been to this practice before.  - [place] MFM OB DETAIL +14 WK; Future  2. Limited prenatal care in third trimester   3. 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  Visible when looking at  patient.  Never fully evaluated.  Plans to breastfeed.  May want to evaluate by ultrasound once breastfeeding has ended.  4.  HX HSV Reviewed suppression at 35 weeks  Preterm labor symptoms and general obstetric precautions including but not limited to vaginal bleeding, contractions, leaking of fluid and fetal movement were reviewed in detail with the patient. Please refer to After Visit Summary for other counseling recommendations.  Return in about 2 weeks (around 08/01/2021) for [place].  Earlie Server, RN, MSN, NP-BC Nurse Practitioner, [facility] for [employer], [employer] 07/18/2021 11:57 AM

## 2021-07-20 ENCOUNTER — Encounter: Payer: Self-pay | Admitting: *Deleted

## 2021-08-02 ENCOUNTER — Ambulatory Visit: Payer: Medicaid Other | Attending: Nurse Practitioner

## 2021-08-02 ENCOUNTER — Other Ambulatory Visit: Payer: Self-pay | Admitting: *Deleted

## 2021-08-02 ENCOUNTER — Encounter: Payer: Self-pay | Admitting: Family Medicine

## 2021-08-02 ENCOUNTER — Other Ambulatory Visit: Payer: Medicaid Other

## 2021-08-02 ENCOUNTER — Encounter: Payer: Self-pay | Admitting: *Deleted

## 2021-08-02 ENCOUNTER — Ambulatory Visit: Payer: Medicaid Other | Admitting: *Deleted

## 2021-08-02 ENCOUNTER — Other Ambulatory Visit: Payer: Self-pay | Admitting: General Practice

## 2021-08-02 ENCOUNTER — Other Ambulatory Visit: Payer: Self-pay

## 2021-08-02 ENCOUNTER — Ambulatory Visit (INDEPENDENT_AMBULATORY_CARE_PROVIDER_SITE_OTHER): Payer: Medicaid Other | Admitting: Family Medicine

## 2021-08-02 VITALS — BP 113/61 | HR 69

## 2021-08-02 VITALS — Wt 180.5 lb

## 2021-08-02 DIAGNOSIS — Z2839 Other underimmunization status: Secondary | ICD-10-CM | POA: Diagnosis present

## 2021-08-02 DIAGNOSIS — O0990 Supervision of high risk pregnancy, unspecified, unspecified trimester: Secondary | ICD-10-CM

## 2021-08-02 DIAGNOSIS — O0933 Supervision of pregnancy with insufficient antenatal care, third trimester: Secondary | ICD-10-CM | POA: Insufficient documentation

## 2021-08-02 DIAGNOSIS — O09899 Supervision of other high risk pregnancies, unspecified trimester: Secondary | ICD-10-CM | POA: Diagnosis present

## 2021-08-02 DIAGNOSIS — Z23 Encounter for immunization: Secondary | ICD-10-CM

## 2021-08-02 DIAGNOSIS — Z362 Encounter for other antenatal screening follow-up: Secondary | ICD-10-CM

## 2021-08-02 NOTE — Progress Notes (Signed)
? ?  Subjective:  ?Ashley Morris is a 26 y.o. G3P2002 at [redacted]w[redacted]d being seen today for ongoing prenatal care.  She is currently monitored for the following issues for this low-risk pregnancy and has Axillary mass; Supervision of high risk pregnancy, antepartum; Limited prenatal care; and Susceptible to varicella (non-immune), currently pregnant on their problem list. ? ?Patient reports no complaints.  Contractions: Not present. Vag. Bleeding: None.  Movement: Present. Denies leaking of fluid.  ? ?The following portions of the patient's history were reviewed and updated as appropriate: allergies, current medications, past family history, past medical history, past social history, past surgical history and problem list. Problem list updated. ? ?Objective:  ? ?Vitals:  ? 08/02/21 1044  ?Weight: 180 lb 8 oz (81.9 kg)  ? ? ?Fetal Status:     Movement: Present    ? ?General:  Alert, oriented and cooperative. Patient is in no acute distress.  ?Skin: Skin is warm and dry. No rash noted.   ?Cardiovascular: Normal heart rate noted  ?Respiratory: Normal respiratory effort, no problems with respiration noted  ?Abdomen: Soft, gravid, appropriate for gestational age. Pain/Pressure: Present     ?Pelvic: Vag. Bleeding: None     ?Cervical exam deferred        ?Extremities: Normal range of motion.  Edema: None  ?Mental Status: Normal mood and affect. Normal behavior. Normal judgment and thought content.  ? ?Urinalysis:     ? ?Assessment and Plan:  ?Pregnancy: G3P2002 at [redacted]w[redacted]d ? ?1. Supervision of high risk pregnancy, antepartum ?TDaP and third trimester labs today ?Unfortunately did not have sufficient time for 2hr GTT though she was fasting, 1 hr GTT obtained instead ?BP and FHR normal in MFM just prior to our visit ?- Tdap vaccine greater than or equal to 7yo IM ?- Glucose, 1 hour gestational ? ?2. Limited prenatal care in third trimester ? ? ?Preterm labor symptoms and general obstetric precautions including but not limited to  vaginal bleeding, contractions, leaking of fluid and fetal movement were reviewed in detail with the patient. ?Please refer to After Visit Summary for other counseling recommendations.  ?Return in 2 weeks (on 08/16/2021) for [facility], ob visit. ? ? ?Venora Maples, MD ? ?

## 2021-08-02 NOTE — Patient Instructions (Signed)

## 2021-08-03 LAB — HEPATITIS C ANTIBODY: Hep C Virus Ab: NONREACTIVE

## 2021-08-03 LAB — GLUCOSE, 1 HOUR GESTATIONAL: Gestational Diabetes Screen: 140 mg/dL — ABNORMAL HIGH (ref 70–139)

## 2021-08-04 ENCOUNTER — Telehealth: Payer: Self-pay | Admitting: Family Medicine

## 2021-08-04 ENCOUNTER — Encounter: Payer: Self-pay | Admitting: Family Medicine

## 2021-08-04 DIAGNOSIS — R7309 Other abnormal glucose: Secondary | ICD-10-CM | POA: Insufficient documentation

## 2021-08-04 NOTE — Telephone Encounter (Signed)
Patient returned my call to get her 2 hour glucose scheduled, she stated she needed to go to work today at 2pm and ask her manager when she would be available to do the 2 hour appointment. I advised patient that we would be closed at 12 and the phones would stop at 11:30 so the patient and I agreed that she would send a mychart message as soon as she found out and I would check that message first thing Monday morning to see when she could be first available for the test.  ?

## 2021-08-04 NOTE — Telephone Encounter (Signed)
Called patient to schedule 2 hour glucose, there was no answer to the call so a voicemail was left with the call back number for the office.  ?

## 2021-08-07 ENCOUNTER — Other Ambulatory Visit: Payer: Medicaid Other

## 2021-08-09 ENCOUNTER — Other Ambulatory Visit: Payer: Medicaid Other

## 2021-08-11 ENCOUNTER — Other Ambulatory Visit: Payer: Self-pay

## 2021-08-11 ENCOUNTER — Other Ambulatory Visit: Payer: Medicaid Other

## 2021-08-11 DIAGNOSIS — O0990 Supervision of high risk pregnancy, unspecified, unspecified trimester: Secondary | ICD-10-CM

## 2021-08-22 ENCOUNTER — Telehealth: Payer: Medicaid Other | Admitting: Physician Assistant

## 2021-08-22 ENCOUNTER — Other Ambulatory Visit: Payer: Self-pay

## 2021-08-22 ENCOUNTER — Inpatient Hospital Stay (HOSPITAL_BASED_OUTPATIENT_CLINIC_OR_DEPARTMENT_OTHER): Payer: Medicaid Other

## 2021-08-22 ENCOUNTER — Encounter (HOSPITAL_COMMUNITY): Payer: Self-pay

## 2021-08-22 ENCOUNTER — Inpatient Hospital Stay (HOSPITAL_COMMUNITY)
Admission: EM | Admit: 2021-08-22 | Discharge: 2021-08-22 | Disposition: A | Discharge status: Home / Self Care | Payer: Medicaid Other | Attending: Obstetrics & Gynecology | Admitting: Obstetrics & Gynecology

## 2021-08-22 DIAGNOSIS — O9A213 Injury, poisoning and certain other consequences of external causes complicating pregnancy, third trimester: Secondary | ICD-10-CM

## 2021-08-22 DIAGNOSIS — Z3A33 33 weeks gestation of pregnancy: Secondary | ICD-10-CM | POA: Diagnosis not present

## 2021-08-22 DIAGNOSIS — W182XXA Fall in (into) shower or empty bathtub, initial encounter: Secondary | ICD-10-CM | POA: Insufficient documentation

## 2021-08-22 DIAGNOSIS — T1490XA Injury, unspecified, initial encounter: Secondary | ICD-10-CM

## 2021-08-22 DIAGNOSIS — F17210 Nicotine dependence, cigarettes, uncomplicated: Secondary | ICD-10-CM | POA: Diagnosis not present

## 2021-08-22 DIAGNOSIS — R102 Pelvic and perineal pain: Secondary | ICD-10-CM

## 2021-08-22 DIAGNOSIS — R079 Chest pain, unspecified: Secondary | ICD-10-CM | POA: Diagnosis not present

## 2021-08-22 DIAGNOSIS — Z20822 Contact with and (suspected) exposure to covid-19: Secondary | ICD-10-CM | POA: Diagnosis not present

## 2021-08-22 DIAGNOSIS — O26893 Other specified pregnancy related conditions, third trimester: Secondary | ICD-10-CM | POA: Diagnosis not present

## 2021-08-22 DIAGNOSIS — O99333 Smoking (tobacco) complicating pregnancy, third trimester: Secondary | ICD-10-CM | POA: Diagnosis not present

## 2021-08-22 DIAGNOSIS — R109 Unspecified abdominal pain: Secondary | ICD-10-CM | POA: Insufficient documentation

## 2021-08-22 DIAGNOSIS — O99513 Diseases of the respiratory system complicating pregnancy, third trimester: Secondary | ICD-10-CM | POA: Diagnosis not present

## 2021-08-22 DIAGNOSIS — J069 Acute upper respiratory infection, unspecified: Secondary | ICD-10-CM

## 2021-08-22 DIAGNOSIS — R059 Cough, unspecified: Secondary | ICD-10-CM | POA: Diagnosis present

## 2021-08-22 LAB — RESP PANEL BY RT-PCR (FLU A&B, COVID) ARPGX2
Influenza A by PCR: NEGATIVE
Influenza B by PCR: NEGATIVE
SARS Coronavirus 2 by RT PCR: NEGATIVE

## 2021-08-22 MED ORDER — ALBUTEROL SULFATE HFA 108 (90 BASE) MCG/ACT IN AERS
2.0000 | INHALATION_SPRAY | RESPIRATORY_TRACT | Status: DC | PRN
  Administered 2021-08-22: 2 via RESPIRATORY_TRACT
  Filled 2021-08-22: qty 6.7

## 2021-08-22 MED ORDER — ACETAMINOPHEN 500 MG PO TABS
1000.0000 mg | ORAL_TABLET | Freq: Once | ORAL | Status: AC
  Administered 2021-08-22: 1000 mg via ORAL
  Filled 2021-08-22: qty 2

## 2021-08-22 MED ORDER — GUAIFENESIN-DM 100-10 MG/5ML PO SYRP
15.0000 mL | ORAL_SOLUTION | Freq: Once | ORAL | Status: AC
  Administered 2021-08-22: 15 mL via ORAL
  Filled 2021-08-22: qty 15

## 2021-08-22 MED ORDER — GUAIFENESIN-DM 100-10 MG/5ML PO SYRP: 15.0000 mL | ORAL_SOLUTION | ORAL | 0 refills | Status: DC | PRN

## 2021-08-22 MED ORDER — CYCLOBENZAPRINE HCL 10 MG PO TABS: 10.0000 mg | ORAL_TABLET | Freq: Two times a day (BID) | ORAL | 0 refills | Status: DC | PRN

## 2021-08-22 NOTE — MAU Note (Signed)
.  Ashley Morris is a 26 y.o. at [redacted]w[redacted]d here in MAU reporting: fall that happened around noon while she was in the shower. and a cough that has been persistent for 2 days  ?Reported chest pain  and left side pain  that will not go away ?LMP: 12/01/2020 ?Onset of complaint: fall around noon  ?Cough for 2days ?Pain score: 7/10 ? MAU Vitals: 113/60/ HR 82 ?Vitals:  ? 08/22/21 1630 08/22/21 1705  ?BP: (!) 111/55 101/61  ?Pulse: 78 83  ?Resp:  18  ?Temp:    ?SpO2: 97% 100%  ?   ?FHT:135 ? ?

## 2021-08-22 NOTE — ED Triage Notes (Signed)
Pt BIB [facility] from home c/o [facility] and a cough x2 days with yellow sputum. Pt is 8 months pregnant.  ?

## 2021-08-22 NOTE — Progress Notes (Signed)
The patient no-showed for appointment despite this provider sending direct link, reaching out via phone with no response and waiting for at least 10 minutes from appointment time for patient to join. They will be marked as a NS for this appointment/time.  ? ?Tam Savoia Cody Alexandr Yaworski, PA-C ? ? ? ?

## 2021-08-22 NOTE — ED Provider Triage Note (Signed)
Emergency Medicine Provider Triage Evaluation Note ? ?Ashley Morris , a 26 y.o. female  was evaluated in triage.  Pt complains of cough and fall.  Patient is late third trimester pregnancy and notes that she has been having a cough so severe that is hurting her chest.  While she was in the shower, she was coughing while reaching for her child when she slipped and fell landing on her left flank.  Denies vaginal bleeding, fevers, chills, urinary symptoms ? ?Review of Systems  ?Positive:  ?Negative:  ? ?Physical Exam  ?BP 107/67 (BP Location: Right Arm)   Pulse 82   Temp 98.8 ?F (37.1 ?C) (Oral)   Resp 18   Ht 5\' 7"  (1.702 m)   Wt 86.2 kg   LMP 12/01/2020 (Approximate)   SpO2 95%   BMI 29.76 kg/m?  ?Gen:   Awake, no distress   ?Resp:  Normal effort, lungs CTA bilaterally ?MSK:   Moves extremities without difficulty  ?Other:  Abdomen is soft, pregnant, mild flank tenderness to palpation without ecchymosis or palpable deformities. ? ?Medical Decision Making  ?Medically screening exam initiated at 2:34 PM.  Appropriate orders placed.  Sanjuanita Gehres was informed that the remainder of the evaluation will be completed by another provider, this initial triage assessment does not replace that evaluation, and the importance of remaining in the ED until their evaluation is complete. ? ? ?  ?Carolynn Serve, Janell Quiet ?08/22/21 1437 ? ?

## 2021-08-22 NOTE — MAU Note (Signed)
Patient is being discharged by provider. Patient pain has improved. She is now rating her pain at a 2/10. Patient given discharge instructions. Medication was reviewed. Patient verbalized understanding. Patient left ambulatory without distress.  ?

## 2021-08-22 NOTE — MAU Provider Note (Signed)
?History  ?  ? ?CSN: [id number] ? ?Arrival date and time: 08/22/21 1254 ? ? Event Date/Time  ? First Provider Initiated Contact with Patient 08/22/21 1901   ?  ? ?Chief Complaint  ?Patient presents with  ? Cough  ? Shortness of Breath  ? Fall  ? ?Ashley Morris is a 26 y.o. year old G39P2002 female at [redacted]w[redacted]d weeks gestation who was transferred to MAU from [facility].  She was transported there by EMS after a fall in the shower. She reports the fall occurred at about 1130 this morning. She arrived in the ED ~ 1300. She states, "they didn't do anything to me the whole time I was over there. They asked me like 10 times what was wrong with me, but still didn't do anything." She reports she has had a cough with yellow phlegm x 2 days. She also complains of CP and LT sided flank pain; where she hit on the side of the shower during her fall. She rates the pain 7/10. She denies any VB or LOF. She reports good (+) FM. She receives [facility] with [facility]; next appt is 08/31/2021.  ? ? ?OB History   ? ? Gravida  ?3  ? Para  ?2  ? Term  ?2  ? Preterm  ?   ? AB  ?0  ? Living  ?2  ?  ? ? SAB  ?0  ? IAB  ?   ? Ectopic  ?   ? Multiple  ?0  ? Live Births  ?2  ?   ?  ?  ? ? ?Past Medical History:  ?Diagnosis Date  ? Abdominal pain in pregnancy, antepartum   ? Asthma   ? Decreased fetal movement affecting management of mother, antepartum 10/07/2014  ? Equivocal NST in clinic, had 10x10 accels but not reactive by criteria. Consulted Dr Debroah Loop who advises she go to MAU for BPP. Patient refuses due to transportation issues. States will come tomorrow.  Risks of fetal distress and still birth discussed.   ? Supervision of normal pregnancy in second trimester 06/17/2014  ?  Clinic  HR Clinic Prenatal Labs  Dating  6 wk ultrasound Blood type: O/POS/-- (05/19 1516)   Genetic Screen  too late, no visits after 1st until 32w Antibody:NEG (05/19 1516)  Anatomic [place]  Normal at 20 wks, but limited face, spine > rescan in 4-6 wks > not done (currently 39 wks) Rubella:  1.64 (05/19 1516)  GTT Early:               Third trimester: 96 RPR: NON REAC (05/19 1516)   Flu vaccine  Dec  ? Urinary tract infection   ? UTI (urinary tract infection)   ? ? ?Past Surgical History:  ?Procedure Laterality Date  ? NO PAST SURGERIES    ? ? ?Family History  ?Problem Relation Age of Onset  ? Hypertension Mother   ? Anemia Mother   ? ? ?Social History  ? ?Tobacco Use  ? Smoking status: Some Days  ?  Years: 8.00  ?  Types: Cigarettes  ? Smokeless tobacco: Never  ?Vaping Use  ? Vaping Use: Never used  ?Substance Use Topics  ? Alcohol use: Not Currently  ? Drug use: Not Currently  ?  Types: Marijuana  ? ? ?Allergies: No Known Allergies ? ?Medications Prior to Admission  ?Medication Sig Dispense Refill Last Dose  ? Prenatal Vit-Fe Fumarate-FA (PREPLUS) 27-1 MG TABS Take 1 tablet by mouth daily. 30 tablet  13   ? ? ?Review of Systems  ?Constitutional: Negative.   ?HENT: Negative.    ?Eyes: Negative.   ?Respiratory:  Positive for cough.   ?Cardiovascular:  Positive for chest pain.  ?Gastrointestinal: Negative.   ?Endocrine: Negative.   ?Genitourinary:  Positive for flank pain (LT side from fall).  ?Musculoskeletal:  Positive for back pain.  ?Skin: Negative.   ?Allergic/Immunologic: Negative.   ?Neurological: Negative.   ?Hematological: Negative.   ?Psychiatric/Behavioral: Negative.    ? ?Physical Exam  ? ?Blood pressure 113/60, pulse 82, temperature 98.6 ?F (37 ?C), temperature source Oral, resp. rate 18, height 5\' 7"  (1.702 m), weight 86.2 kg, last menstrual period 12/01/2020, SpO2 99 %. ? ?Physical Exam ?Vitals and nursing note reviewed.  ?Constitutional:   ?   Appearance: Normal appearance. She is normal weight.  ?Cardiovascular:  ?   Rate and Rhythm: Normal rate.  ?Pulmonary:  ?   Effort: Pulmonary effort is normal.  ?Abdominal:  ?   Palpations: Abdomen is soft.  ?Musculoskeletal:     ?   General: Normal range of motion.  ?Skin: ?   General: Skin is warm and dry.  ?Neurological:  ?   Mental Status: She is  alert and oriented to person, place, and time.  ?Psychiatric:     ?   Mood and Affect: Mood normal.     ?   Behavior: Behavior normal.     ?   Thought Content: Thought content normal.     ?   Judgment: Judgment normal.  ? ?REACTIVE NST - FHR: 135 bpm / moderate variability / accels present / decels absent / TOCO: none ?MAU Course  ?Procedures ? ?MDM ?COVID swab ?Albuterol inhaler 2 puffs with spacer ?Tylenol 1000 mg po ?Robitussin-DM 15 ml po ?EKG -- *Consult with Dr. 12/03/2020 (cardiologist) @ 2052 - notified of patient's complaints, assessments and EKG results, per Dr. 2053 no abnormalities seen on EKG - ok to d/c home  ? ?Results for orders placed or performed during the hospital encounter of 08/22/21 (from the past 24 hour(s))  ?Resp Panel by RT-PCR (Flu A&B, Covid) Nasopharyngeal Swab     Status: None  ? Collection Time: 08/22/21  6:39 PM  ? Specimen: Nasopharyngeal Swab; Nasopharyngeal(NP) swabs in vial transport medium  ?Result Value Ref Range  ? SARS Coronavirus 2 by RT PCR NEGATIVE NEGATIVE  ? Influenza A by PCR NEGATIVE NEGATIVE  ? Influenza B by PCR NEGATIVE NEGATIVE  ?  ?Assessment and Plan  ?Upper respiratory tract infection, unspecified type  ?- Information provided on URI ?  ?Traumatic injury during pregnancy in third trimester  ?- Information provided on preventing injuries in pregnancy ?  ?[redacted] weeks gestation of pregnancy  ? ?- Discharge patient ?- Keep scheduled appointment on 09/06/2021 ?- Patient verbalized an understanding of the plan of care and agrees.  ? ? ?09/08/2021, CNM ?08/22/2021, 7:12 PM  ?

## 2021-08-31 ENCOUNTER — Ambulatory Visit: Payer: Medicaid Other | Attending: Obstetrics and Gynecology

## 2021-08-31 ENCOUNTER — Ambulatory Visit: Payer: Medicaid Other | Admitting: *Deleted

## 2021-08-31 ENCOUNTER — Ambulatory Visit (INDEPENDENT_AMBULATORY_CARE_PROVIDER_SITE_OTHER): Payer: Medicaid Other | Admitting: Family Medicine

## 2021-08-31 VITALS — BP 123/61 | HR 74

## 2021-08-31 VITALS — BP 114/62 | HR 81 | Wt 182.9 lb

## 2021-08-31 DIAGNOSIS — Z362 Encounter for other antenatal screening follow-up: Secondary | ICD-10-CM | POA: Insufficient documentation

## 2021-08-31 DIAGNOSIS — O09899 Supervision of other high risk pregnancies, unspecified trimester: Secondary | ICD-10-CM | POA: Diagnosis present

## 2021-08-31 DIAGNOSIS — O0933 Supervision of pregnancy with insufficient antenatal care, third trimester: Secondary | ICD-10-CM | POA: Diagnosis present

## 2021-08-31 DIAGNOSIS — Z2839 Other underimmunization status: Secondary | ICD-10-CM | POA: Diagnosis present

## 2021-08-31 DIAGNOSIS — Z3A35 35 weeks gestation of pregnancy: Secondary | ICD-10-CM

## 2021-08-31 DIAGNOSIS — O0990 Supervision of high risk pregnancy, unspecified, unspecified trimester: Secondary | ICD-10-CM | POA: Insufficient documentation

## 2021-08-31 DIAGNOSIS — R7309 Other abnormal glucose: Secondary | ICD-10-CM

## 2021-08-31 NOTE — Progress Notes (Signed)
? ?  PRENATAL VISIT NOTE ? ?Subjective:  ?Ashley Morris is a 26 y.o. G3P2002 at [redacted]w[redacted]d being seen today for ongoing prenatal care.  She is currently monitored for the following issues for this low-risk pregnancy and has Axillary mass; Supervision of high risk pregnancy, antepartum; Limited prenatal care; Susceptible to varicella (non-immune), currently pregnant; and Abnormal GTT (glucose tolerance test) on their problem list. ? ?Patient reports no complaints.  Contractions: Irritability. Vag. Bleeding: None.  Movement: Present. Denies leaking of fluid.  ? ?The following portions of the patient's history were reviewed and updated as appropriate: allergies, current medications, past family history, past medical history, past social history, past surgical history and problem list.  ? ?Objective:  ? ?Vitals:  ? 08/31/21 1045  ?BP: 114/62  ?Pulse: 81  ?Weight: 182 lb 14.4 oz (83 kg)  ? ? ?Fetal Status: Fetal Heart Rate (bpm): 123   Movement: Present    ? ?General:  Alert, oriented and cooperative. Patient is in no acute distress.  ?Skin: Skin is warm and dry. No rash noted.   ?Cardiovascular: Normal heart rate noted  ?Respiratory: Normal respiratory effort, no problems with respiration noted  ?Abdomen: Soft, gravid, appropriate for gestational age.  Pain/Pressure: Present     ?Pelvic: Cervical exam deferred        ?Extremities: Normal range of motion.  Edema: None  ?Mental Status: Normal mood and affect. Normal behavior. Normal judgment and thought content.  ? ?Assessment and Plan:  ?Pregnancy: [id number] at [redacted]w[redacted]d ?1. Supervision of high risk pregnancy, antepartum ?Continue routine prenatal care. ? ?2. Abnormal GTT (glucose tolerance test) ?Declines 2 hour, recent normal growth today. Doubt GDM ? ?Preterm labor symptoms and general obstetric precautions including but not limited to vaginal bleeding, contractions, leaking of fluid and fetal movement were reviewed in detail with the patient. ?Please refer to After Visit  Summary for other counseling recommendations.  ? ?Return in about 1 week (around 09/07/2021) for [facility]. ? ?Future Appointments  ?Date Time Provider [place]  ?09/14/2021  9:15 AM Luvenia Redden, PA-C [facility]  ? ? ?Donnamae Jude, MD ? ?

## 2021-09-14 ENCOUNTER — Other Ambulatory Visit (HOSPITAL_COMMUNITY)
Admission: RE | Admit: 2021-09-14 | Discharge: 2021-09-14 | Disposition: A | Discharge status: Home / Self Care | Payer: Medicaid Other | Source: Ambulatory Visit | Attending: Medical | Admitting: Medical

## 2021-09-14 ENCOUNTER — Ambulatory Visit (INDEPENDENT_AMBULATORY_CARE_PROVIDER_SITE_OTHER): Payer: Medicaid Other | Admitting: Medical

## 2021-09-14 ENCOUNTER — Encounter: Payer: Self-pay | Admitting: Medical

## 2021-09-14 VITALS — BP 109/65 | HR 91 | Wt 186.0 lb

## 2021-09-14 DIAGNOSIS — O0990 Supervision of high risk pregnancy, unspecified, unspecified trimester: Secondary | ICD-10-CM | POA: Insufficient documentation

## 2021-09-14 DIAGNOSIS — Z3A37 37 weeks gestation of pregnancy: Secondary | ICD-10-CM

## 2021-09-14 DIAGNOSIS — B009 Herpesviral infection, unspecified: Secondary | ICD-10-CM | POA: Insufficient documentation

## 2021-09-14 DIAGNOSIS — R7309 Other abnormal glucose: Secondary | ICD-10-CM

## 2021-09-14 MED ORDER — VALACYCLOVIR HCL 500 MG PO TABS: 500.0000 mg | ORAL_TABLET | Freq: Two times a day (BID) | ORAL | 1 refills | Status: DC

## 2021-09-14 NOTE — Progress Notes (Signed)
? ?  PRENATAL VISIT NOTE ? ?Subjective:  ?Ashley Morris is a 26 y.o. G3P2002 at [redacted]w[redacted]d being seen today for ongoing prenatal care.  She is currently monitored for the following issues for this high-risk pregnancy and has Axillary mass; Supervision of high risk pregnancy, antepartum; Limited prenatal care; Susceptible to varicella (non-immune), currently pregnant; Abnormal GTT (glucose tolerance test); and HSV infection on their problem list. ? ?Patient reports fatigue.  Contractions: Irritability. Vag. Bleeding: None.  Movement: Present. Denies leaking of fluid.  ? ?The following portions of the patient's history were reviewed and updated as appropriate: allergies, current medications, past family history, past medical history, past social history, past surgical history and problem list.  ? ?Objective:  ? ?Vitals:  ? 09/14/21 0943  ?BP: 109/65  ?Pulse: 91  ?Weight: 186 lb (84.4 kg)  ? ? ?Fetal Status: Fetal Heart Rate (bpm): 143   Movement: Present  Presentation: Vertex ? ?General:  Alert, oriented and cooperative. Patient is in no acute distress.  ?Skin: Skin is warm and dry. No rash noted.   ?Cardiovascular: Normal heart rate noted  ?Respiratory: Normal respiratory effort, no problems with respiration noted  ?Abdomen: Soft, gravid, appropriate for gestational age.  Pain/Pressure: Present     ?Pelvic: Cervical exam performed in the presence of a chaperone Dilation: Closed Effacement (%): Thick Station: -3  ?Extremities: Normal range of motion.  Edema: None  ?Mental Status: Normal mood and affect. Normal behavior. Normal judgment and thought content.  ? ?Assessment and Plan:  ?Pregnancy: [id number] at [redacted]w[redacted]d ?1. Supervision of high risk pregnancy, antepartum ?- Culture, beta strep (group b only) ?- GC/Chlamydia probe amp ()not at [facility] ? ?2. HSV infection ?- valACYclovir (VALTREX) 500 MG tablet; Take 1 tablet (500 mg total) by mouth 2 (two) times daily.  Dispense: 60 tablet; Refill: 1 ? ?3. Abnormal GTT  (glucose tolerance test) ?- Patient failed 1 hour, declined 2 hour testing ?- Per last provider note, unlikely to be GDM due to normal EFW and AFI ? ?4. [redacted] weeks gestation of pregnancy ? ?Term labor symptoms and general obstetric precautions including but not limited to vaginal bleeding, contractions, leaking of fluid and fetal movement were reviewed in detail with the patient. ?Please refer to After Visit Summary for other counseling recommendations.  ? ?Return in about 1 week (around 09/21/2021) for [facility], In-Person. ? ?Future Appointments  ?Date Time Provider [place]  ?09/22/2021  9:35 AM Luvenia Redden, PA-C [facility]  ?10/02/2021 10:35 AM Luvenia Redden, PA-C [facility]  ? ? ?Kerry Hough, PA-C ? ?

## 2021-09-15 LAB — GC/CHLAMYDIA PROBE AMP (~~LOC~~) NOT AT ARMC
Chlamydia: NEGATIVE
Comment: NEGATIVE
Comment: NORMAL
Neisseria Gonorrhea: NEGATIVE

## 2021-09-17 LAB — CULTURE, BETA STREP (GROUP B ONLY): Strep Gp B Culture: NEGATIVE

## 2021-09-22 ENCOUNTER — Encounter: Payer: Self-pay | Admitting: Medical

## 2021-09-22 ENCOUNTER — Ambulatory Visit (INDEPENDENT_AMBULATORY_CARE_PROVIDER_SITE_OTHER): Payer: Medicaid Other | Admitting: Medical

## 2021-09-22 VITALS — BP 110/71 | HR 81 | Wt 188.1 lb

## 2021-09-22 DIAGNOSIS — O0990 Supervision of high risk pregnancy, unspecified, unspecified trimester: Secondary | ICD-10-CM

## 2021-09-22 DIAGNOSIS — O0933 Supervision of pregnancy with insufficient antenatal care, third trimester: Secondary | ICD-10-CM

## 2021-09-22 DIAGNOSIS — R7309 Other abnormal glucose: Secondary | ICD-10-CM

## 2021-09-22 DIAGNOSIS — B009 Herpesviral infection, unspecified: Secondary | ICD-10-CM | POA: Diagnosis not present

## 2021-09-22 NOTE — Progress Notes (Signed)
? ?  PRENATAL VISIT NOTE ? ?Subjective:  ?Ashley Morris is a 26 y.o. G3P2002 at [redacted]w[redacted]d being seen today for ongoing prenatal care.  She is currently monitored for the following issues for this high-risk pregnancy and has Axillary mass; Supervision of high risk pregnancy, antepartum; Limited prenatal care; Susceptible to varicella (non-immune), currently pregnant; Abnormal GTT (glucose tolerance test); and HSV infection on their problem list. ? ?Patient reports fatigue and occasional contractions.  Contractions: Irritability. Vag. Bleeding: None.  Movement: Present. Denies leaking of fluid.  ? ?The following portions of the patient's history were reviewed and updated as appropriate: allergies, current medications, past family history, past medical history, past social history, past surgical history and problem list.  ? ?Objective:  ? ?Vitals:  ? 09/22/21 0937  ?BP: 110/71  ?Pulse: 81  ?Weight: 188 lb 1.6 oz (85.3 kg)  ? ? ?Fetal Status: Fetal Heart Rate (bpm): 145 Fundal Height: 39 cm Movement: Present  Presentation: Vertex ? ?General:  Alert, oriented and cooperative. Patient is in no acute distress.  ?Skin: Skin is warm and dry. No rash noted.   ?Cardiovascular: Normal heart rate noted  ?Respiratory: Normal respiratory effort, no problems with respiration noted  ?Abdomen: Soft, gravid, appropriate for gestational age.  Pain/Pressure: Present     ?Pelvic: Cervical exam performed in the presence of a chaperone Dilation: 1.5 Effacement (%): Thick Station: -3  ?Extremities: Normal range of motion.  Edema: None  ?Mental Status: Normal mood and affect. Normal behavior. Normal judgment and thought content.  ? ?Assessment and Plan:  ?Pregnancy: [id number] at [redacted]w[redacted]d ?1. Supervision of high risk pregnancy, antepartum ?- Negative GBS from last visit discussed  ? ?2. HSV infection ?- Patient given Rx at last visit, states that she knows her body and when she starts the meds she gets an outbreak, so she will not start unless she has  symptoms ?- Advised that if there are any active lesions she will have a C/S, she understands  ? ?3. Abnormal GTT (glucose tolerance test) ?- Failed 1 hour, declined 2 hours ?- See previous notes  ? ?4. Limited prenatal care in third trimester ? ?Term labor symptoms and general obstetric precautions including but not limited to vaginal bleeding, contractions, leaking of fluid and fetal movement were reviewed in detail with the patient. ?Please refer to After Visit Summary for other counseling recommendations.  ? ?Return in about 1 week (around 09/29/2021) for LOB, In-Person. ? ?Future Appointments  ?Date Time Provider [place]  ?10/02/2021 10:35 AM Hillard Danker Myles Rosenthal, PA-C [facility]  ?10/05/2021 10:15 AM [facility] NST [facility] [place]  ?10/12/2021  9:35 AM Patriciaann Clan, DO [facility]  ?10/12/2021 11:15 AM [facility] N[facility]  ? ? ?Kerry Hough, PA-C ? ?

## 2021-09-28 ENCOUNTER — Other Ambulatory Visit: Payer: Self-pay

## 2021-09-28 ENCOUNTER — Encounter (HOSPITAL_COMMUNITY): Payer: Self-pay | Admitting: Anesthesiology

## 2021-09-28 ENCOUNTER — Inpatient Hospital Stay (HOSPITAL_COMMUNITY)
Admission: AD | Admit: 2021-09-28 | Discharge: 2021-09-30 | DRG: 807 | Disposition: A | Discharge status: Home / Self Care | Payer: Medicaid Other | Attending: Obstetrics & Gynecology | Admitting: Obstetrics & Gynecology

## 2021-09-28 ENCOUNTER — Encounter (HOSPITAL_COMMUNITY): Payer: Self-pay | Admitting: Obstetrics and Gynecology

## 2021-09-28 ENCOUNTER — Telehealth: Payer: Self-pay | Admitting: General Practice

## 2021-09-28 DIAGNOSIS — O99334 Smoking (tobacco) complicating childbirth: Secondary | ICD-10-CM | POA: Diagnosis present

## 2021-09-28 DIAGNOSIS — R7309 Other abnormal glucose: Secondary | ICD-10-CM | POA: Diagnosis present

## 2021-09-28 DIAGNOSIS — O368190 Decreased fetal movements, unspecified trimester, not applicable or unspecified: Secondary | ICD-10-CM | POA: Diagnosis not present

## 2021-09-28 DIAGNOSIS — B009 Herpesviral infection, unspecified: Secondary | ICD-10-CM | POA: Diagnosis present

## 2021-09-28 DIAGNOSIS — Z3A39 39 weeks gestation of pregnancy: Secondary | ICD-10-CM | POA: Diagnosis not present

## 2021-09-28 DIAGNOSIS — O0990 Supervision of high risk pregnancy, unspecified, unspecified trimester: Principal | ICD-10-CM

## 2021-09-28 DIAGNOSIS — F17210 Nicotine dependence, cigarettes, uncomplicated: Secondary | ICD-10-CM | POA: Diagnosis present

## 2021-09-28 DIAGNOSIS — O0930 Supervision of pregnancy with insufficient antenatal care, unspecified trimester: Secondary | ICD-10-CM

## 2021-09-28 DIAGNOSIS — O368130 Decreased fetal movements, third trimester, not applicable or unspecified: Principal | ICD-10-CM | POA: Diagnosis present

## 2021-09-28 DIAGNOSIS — Z30017 Encounter for initial prescription of implantable subdermal contraceptive: Secondary | ICD-10-CM

## 2021-09-28 DIAGNOSIS — O9852 Other viral diseases complicating childbirth: Secondary | ICD-10-CM | POA: Diagnosis not present

## 2021-09-28 DIAGNOSIS — Z2839 Other underimmunization status: Secondary | ICD-10-CM

## 2021-09-28 DIAGNOSIS — O09899 Supervision of other high risk pregnancies, unspecified trimester: Secondary | ICD-10-CM

## 2021-09-28 LAB — CBC
HCT: 35.6 % — ABNORMAL LOW (ref 36.0–46.0)
Hemoglobin: 12.1 g/dL (ref 12.0–15.0)
MCH: 32 pg (ref 26.0–34.0)
MCHC: 34 g/dL (ref 30.0–36.0)
MCV: 94.2 fL (ref 80.0–100.0)
Platelets: 197 10*3/uL (ref 150–400)
RBC: 3.78 MIL/uL — ABNORMAL LOW (ref 3.87–5.11)
RDW: 15.1 % (ref 11.5–15.5)
WBC: 7.4 10*3/uL (ref 4.0–10.5)
nRBC: 0 % (ref 0.0–0.2)

## 2021-09-28 LAB — GLUCOSE, CAPILLARY
Glucose-Capillary: 111 mg/dL — ABNORMAL HIGH (ref 70–99)
Glucose-Capillary: 72 mg/dL (ref 70–99)

## 2021-09-28 LAB — HIV ANTIBODY (ROUTINE TESTING W REFLEX): HIV Screen 4th Generation wRfx: NONREACTIVE

## 2021-09-28 LAB — RPR: RPR Ser Ql: NONREACTIVE

## 2021-09-28 LAB — TYPE AND SCREEN
ABO/RH(D): O POS
Antibody Screen: NEGATIVE

## 2021-09-28 MED ORDER — DIPHENHYDRAMINE HCL 50 MG/ML IJ SOLN: 12.5000 mg | INTRAMUSCULAR | Status: DC | PRN

## 2021-09-28 MED ORDER — SOD CITRATE-CITRIC ACID 500-334 MG/5ML PO SOLN: 30.0000 mL | ORAL | Status: DC | PRN

## 2021-09-28 MED ORDER — MEDROXYPROGESTERONE ACETATE 150 MG/ML IM SUSP: 150.0000 mg | INTRAMUSCULAR | Status: DC | PRN

## 2021-09-28 MED ORDER — IBUPROFEN 600 MG PO TABS
600.0000 mg | ORAL_TABLET | Freq: Four times a day (QID) | ORAL | Status: DC
  Administered 2021-09-28 – 2021-09-30 (×6): 600 mg via ORAL
  Filled 2021-09-28 (×6): qty 1

## 2021-09-28 MED ORDER — FENTANYL-BUPIVACAINE-NACL 0.5-0.125-0.9 MG/250ML-% EP SOLN
12.0000 mL/h | EPIDURAL | Status: DC | PRN
  Filled 2021-09-28: qty 250

## 2021-09-28 MED ORDER — DOCUSATE SODIUM 100 MG PO CAPS
100.0000 mg | ORAL_CAPSULE | Freq: Two times a day (BID) | ORAL | Status: DC
  Administered 2021-09-29 (×2): 100 mg via ORAL
  Filled 2021-09-28 (×2): qty 1

## 2021-09-28 MED ORDER — EPHEDRINE 5 MG/ML INJ: 10.0000 mg | INTRAVENOUS | Status: DC | PRN

## 2021-09-28 MED ORDER — OXYTOCIN BOLUS FROM INFUSION
333.0000 mL | Freq: Once | INTRAVENOUS | Status: AC
  Administered 2021-09-28: 333 mL via INTRAVENOUS

## 2021-09-28 MED ORDER — COCONUT OIL OIL: 1.0000 "application " | TOPICAL_OIL | Status: DC | PRN

## 2021-09-28 MED ORDER — FERROUS SULFATE 325 (65 FE) MG PO TABS
325.0000 mg | ORAL_TABLET | ORAL | Status: DC
  Administered 2021-09-29: 325 mg via ORAL
  Filled 2021-09-28: qty 1

## 2021-09-28 MED ORDER — MEASLES, MUMPS & RUBELLA VAC IJ SOLR: 0.5000 mL | Freq: Once | INTRAMUSCULAR | Status: DC

## 2021-09-28 MED ORDER — PHENYLEPHRINE 80 MCG/ML (10ML) SYRINGE FOR IV PUSH (FOR BLOOD PRESSURE SUPPORT): 80.0000 ug | PREFILLED_SYRINGE | INTRAVENOUS | Status: DC | PRN

## 2021-09-28 MED ORDER — BENZOCAINE-MENTHOL 20-0.5 % EX AERO: 1.0000 "application " | INHALATION_SPRAY | CUTANEOUS | Status: DC | PRN

## 2021-09-28 MED ORDER — ACETAMINOPHEN 325 MG PO TABS: 650.0000 mg | ORAL_TABLET | ORAL | Status: DC | PRN

## 2021-09-28 MED ORDER — LACTATED RINGERS IV SOLN: 500.0000 mL | Freq: Once | INTRAVENOUS | Status: DC

## 2021-09-28 MED ORDER — BISACODYL 10 MG RE SUPP: 10.0000 mg | Freq: Every day | RECTAL | Status: DC | PRN

## 2021-09-28 MED ORDER — FLEET ENEMA 7-19 GM/118ML RE ENEM
1.0000 | ENEMA | Freq: Every day | RECTAL | Status: DC | PRN
Start: 2021-09-28 — End: 2021-09-30

## 2021-09-28 MED ORDER — OXYTOCIN-SODIUM CHLORIDE 30-0.9 UT/500ML-% IV SOLN
2.5000 [IU]/h | INTRAVENOUS | Status: DC
  Administered 2021-09-28: 2.5 [IU]/h via INTRAVENOUS
  Filled 2021-09-28: qty 500

## 2021-09-28 MED ORDER — SIMETHICONE 80 MG PO CHEW: 80.0000 mg | CHEWABLE_TABLET | ORAL | Status: DC | PRN

## 2021-09-28 MED ORDER — LACTATED RINGERS IV SOLN: 500.0000 mL | INTRAVENOUS | Status: DC | PRN

## 2021-09-28 MED ORDER — ONDANSETRON HCL 4 MG/2ML IJ SOLN: 4.0000 mg | Freq: Four times a day (QID) | INTRAMUSCULAR | Status: DC | PRN

## 2021-09-28 MED ORDER — LACTATED RINGERS IV SOLN: INTRAVENOUS | Status: DC

## 2021-09-28 MED ORDER — MISOPROSTOL 50MCG HALF TABLET
50.0000 ug | ORAL_TABLET | ORAL | Status: DC
  Administered 2021-09-28: 50 ug via BUCCAL
  Filled 2021-09-28: qty 1

## 2021-09-28 MED ORDER — DIPHENHYDRAMINE HCL 25 MG PO CAPS
25.0000 mg | ORAL_CAPSULE | Freq: Four times a day (QID) | ORAL | Status: DC | PRN
Start: 2021-09-28 — End: 2021-09-30

## 2021-09-28 MED ORDER — FENTANYL CITRATE (PF) 100 MCG/2ML IJ SOLN
INTRAMUSCULAR | Status: AC
  Filled 2021-09-28: qty 2

## 2021-09-28 MED ORDER — TETANUS-DIPHTH-ACELL PERTUSSIS 5-2.5-18.5 LF-MCG/0.5 IM SUSY: 0.5000 mL | PREFILLED_SYRINGE | Freq: Once | INTRAMUSCULAR | Status: DC

## 2021-09-28 MED ORDER — FENTANYL CITRATE (PF) 100 MCG/2ML IJ SOLN
100.0000 ug | INTRAMUSCULAR | Status: DC | PRN
  Administered 2021-09-28 (×2): 100 ug via INTRAVENOUS
  Filled 2021-09-28: qty 2

## 2021-09-28 MED ORDER — PRENATAL MULTIVITAMIN CH
1.0000 | ORAL_TABLET | Freq: Every day | ORAL | Status: DC
  Administered 2021-09-29: 1 via ORAL
  Filled 2021-09-28: qty 1

## 2021-09-28 MED ORDER — LIDOCAINE HCL (PF) 1 % IJ SOLN: 30.0000 mL | INTRAMUSCULAR | Status: DC | PRN

## 2021-09-28 NOTE — Progress Notes (Signed)
Patient Vitals for the past 4 hrs: ? BP Pulse  ?09/28/21 1656 (!) 95/56 80  ? ?Ctx have been much stronger over the past 1.5 hours, q 3-4 minutes.  FHR Cat 1. Cx 5/100/-1.  Pt is by herself, family on the way. Will AROM when they get here.  ?

## 2021-09-28 NOTE — MAU Note (Signed)
.  Ashley Morris is a 26 y.o. at [redacted]w[redacted]d here in MAU reporting: ctx that started around 0800 this morning when she woke up. She states her contractions are every 3-5 minutes apart. She is also having spotting. Denies LOF. She is feeling fetal movement but states it is decreased today.  ? ?Pain score: 8 ?Vitals:  ? 09/28/21 0940  ?BP: 106/66  ?Pulse: 83  ?Temp: (!) 97.4 ?F (36.3 ?C)  ?SpO2: 100%  ?   ?  ?

## 2021-09-28 NOTE — Telephone Encounter (Signed)
Called patient regarding mychart message concerning bleeding. No answer- left message to call us back. Will respond via mychart ?

## 2021-09-28 NOTE — MAU Provider Note (Signed)
?History  ?  ? ?CSN: [id number] ? ?Arrival date and time: 09/28/21 0937 ? ? Event Date/Time  ? First Provider Initiated Contact with Patient 09/28/21 [phone]   ?  ? ?Chief Complaint  ?Patient presents with  ? Contractions  ? Decreased Fetal Movement  ? ?HPI ?Ashley Morris is a 26 y.o. G3P2002 at [redacted]w[redacted]d who presents to MAU via EMS with chief complaint of contractions and vaginal spotting. Patient states she woke up around 0800, saw blood when she wiped after voiding, became aware of new onset contractions and called EMS. She denies leaking of fluid. ? ?Patient also c/o decreased fetal movement. This is a new problem, onset today. Patient has only felt one movement since waking up and calling EMS. She has been drinking water but has not yet had anything to eat today. ? ?Pregnancy complicated by interrupted prenatal care and HSV, not on suppression.  ? ?OB History   ? ? Gravida  ?3  ? Para  ?2  ? Term  ?2  ? Preterm  ?0  ? AB  ?0  ? Living  ?2  ?  ? ? SAB  ?0  ? IAB  ?0  ? Ectopic  ?0  ? Multiple  ?0  ? Live Births  ?2  ?   ?  ?  ? ? ?Past Medical History:  ?Diagnosis Date  ? Abdominal pain in pregnancy, antepartum   ? Asthma   ? Decreased fetal movement affecting management of mother, antepartum 10/07/2014  ? Equivocal NST in clinic, had 10x10 accels but not reactive by criteria. Consulted Dr Debroah Loop who advises she go to MAU for BPP. Patient refuses due to transportation issues. States will come tomorrow.  Risks of fetal distress and still birth discussed.   ? Supervision of normal pregnancy in second trimester 06/17/2014  ?  Clinic  HR Clinic Prenatal Labs  Dating  6 wk ultrasound Blood type: O/POS/-- (05/19 1516)   Genetic Screen  too late, no visits after 1st until 32w Antibody:NEG (05/19 1516)  Anatomic [place]  Normal at 20 wks, but limited face, spine > rescan in 4-6 wks > not done (currently 39 wks) Rubella: 1.64 (05/19 1516)  GTT Early:               Third trimester: 96 RPR: NON REAC (05/19 1516)   Flu vaccine  Dec  ?  Urinary tract infection   ? UTI (urinary tract infection)   ? ? ?Past Surgical History:  ?Procedure Laterality Date  ? NO PAST SURGERIES    ? ? ?Family History  ?Problem Relation Age of Onset  ? Hypertension Mother   ? Anemia Mother   ? ? ?Social History  ? ?Tobacco Use  ? Smoking status: Some Days  ?  Years: 8.00  ?  Types: Cigarettes  ? Smokeless tobacco: Never  ?Vaping Use  ? Vaping Use: Never used  ?Substance Use Topics  ? Alcohol use: Not Currently  ? Drug use: Not Currently  ?  Types: Marijuana  ?  Comment: about a month ago  ? ? ?Allergies: No Known Allergies ? ?Medications Prior to Admission  ?Medication Sig Dispense Refill Last Dose  ? Prenatal Vit-Fe Fumarate-FA (PREPLUS) 27-1 MG TABS Take 1 tablet by mouth daily. 30 tablet 13   ? valACYclovir (VALTREX) 500 MG tablet Take 1 tablet (500 mg total) by mouth 2 (two) times daily. (Patient not taking: Reported on 09/22/2021) 60 tablet 1   ? ? ?Review of Systems  ?  Gastrointestinal:  Positive for abdominal pain.  ?Genitourinary:  Positive for vaginal bleeding.  ?All other systems reviewed and are negative. ?Physical Exam  ? ?Blood pressure 106/66, pulse 83, temperature (!) 97.4 ?F (36.3 ?C), temperature source Oral, last menstrual period 12/01/2020, SpO2 100 %. ? ?Physical Exam ?Vitals and nursing note reviewed. Exam conducted with a chaperone present.  ?Constitutional:   ?   Appearance: Normal appearance.  ?Cardiovascular:  ?   Rate and Rhythm: Normal rate and regular rhythm.  ?   Pulses: Normal pulses.  ?Pulmonary:  ?   Effort: Pulmonary effort is normal.  ?Abdominal:  ?   Comments: Gravid  ?Skin: ?   Capillary Refill: Capillary refill takes less than 2 seconds.  ?Neurological:  ?   Mental Status: She is alert and oriented to person, place, and time.  ?Psychiatric:     ?   Mood and Affect: Mood normal.     ?   Behavior: Behavior normal.     ?   Thought Content: Thought content normal.     ?   Judgment: Judgment normal.  ? ? ?MAU Course   ?Procedures ? ?MDM ?--Reactive tracing: baseline 125, mod var, + 15 x 15 accels, no decels ?--Toco: irregular contractions palpate mild ?--Pregnancy significant for interrupted prenatal care as patient transitioned from [place] to [place].  ?--Given ongoing report of significantly decreased fetal movement at [redacted]w[redacted]d , admission advised. Dr. Para March in agreement. ? --Patient agreeable to being augmented once admitted to L&D ? ?Patient Vitals for the past 24 hrs: ? BP Temp Temp src Pulse SpO2  ?09/28/21 0940 106/66 (!) 97.4 ?F (36.3 ?C) Oral 83 100 %  ? ?Assessment and Plan  ?--26 y.o. G3P2002 at [redacted]w[redacted]d  ?--Reactive tracing ?--Favorable cervix ?--Patient reporting negligible fetal movement ?--Admit to L&D, report called to Dr. Ephriam Jenkins ? ?Calvert Cantor, MSA, MSN, CNM ?09/28/2021, 11:37 AM  ?

## 2021-09-28 NOTE — Discharge Summary (Signed)
Postpartum Discharge Summary ?   ?Patient Name: Ashley Morris ?DOB: 1995/11/07 ?MRN: [id number] ? ?Date of admission: 09/28/2021 ?Delivery date:09/28/2021  ?Delivering provider: Christin Fudge  ?Date of discharge: 09/30/2021 ? ?Admitting diagnosis: Normal labor [O80, Z37.9] ?Intrauterine pregnancy: [redacted]w[redacted]d    ?Secondary diagnosis:  Principal Problem: ?  Normal labor ?Active Problems: ?  Supervision of high risk pregnancy, antepartum ?  Limited prenatal care ?  Abnormal GTT (glucose tolerance test) ?  HSV infection ? ?Additional problems: decreased fetal movement    ?Discharge diagnosis: Term Pregnancy Delivered                                              ?Post partum procedures: Nexplanon placement  ?Augmentation: AROM and Cytotec ?Complications: None ? ?Hospital course: Induction of Labor With Vaginal Delivery   ?26y.o. yo G3P2002 at 310w1das admitted to the hospital 09/28/2021 for induction of labor.  Indication for induction:  decreased fetal movement .  Patient had an uncomplicated labor course as follows: ?Membrane Rupture Time/Date: 7:45 PM ,09/28/2021   ?Delivery Method:Vaginal, Spontaneous  ?Episiotomy: None  ?Lacerations:  None  ?Details of delivery can be found in separate delivery note.  Patient had a routine postpartum course. Patient is discharged home 09/30/21. ? ?Newborn Data: ?Birth date:09/28/2021  ?Birth time:7:53 PM  ?Gender:Female  ?Living status:Living  ?Apgars:8 ,9  ?Weight:3010 g  ? ?Magnesium Sulfate received: No ?BMZ received: No ?Rhophylac:N/A ?MMR:N/A ?T-DaP:Given prenatally ?Flu: N/A ?Transfusion:No ? ?Physical exam  ?Vitals:  ? 09/29/21 0826 09/29/21 1638 09/29/21 2158 09/30/21 0516  ?BP: 116/67 116/73 118/83 120/82  ?Pulse: 60 73 74 76  ?Resp: 18 18 18    ?Temp: 98.1 ?F (36.7 ?C) 98.1 ?F (36.7 ?C) 98.4 ?F (36.9 ?C)   ?TempSrc: Oral Oral Oral   ?SpO2: 100%  100%   ? ?General: alert, cooperative, and no distress ?Lochia: appropriate ?Uterine Fundus: firm ?Incision: Healing well with  no significant drainage, No significant erythema, Dressing is clean, dry, and intact ?DVT Evaluation: No evidence of DVT seen on physical exam. ?Negative Homan's sign. ?No cords or calf tenderness. ?No significant calf/ankle edema. ?Labs: ?Lab Results  ?Component Value Date  ? WBC 12.2 (H) 09/29/2021  ? HGB 12.2 09/29/2021  ? HCT 35.9 (L) 09/29/2021  ? MCV 93.5 09/29/2021  ? PLT 193 09/29/2021  ? ? ?  Latest Ref Rng & Units 09/19/2019  ?  4:42 PM  ?CMP  ?Glucose 70 - 99 mg/dL 93    ?BUN 6 - 20 mg/dL 8    ?Creatinine 0.44 - 1.00 mg/dL 0.66    ?Sodium 135 - 145 mmol/L 136    ?Potassium 3.5 - 5.1 mmol/L 3.3    ?Chloride 98 - 111 mmol/L 102    ?CO2 22 - 32 mmol/L 24    ?Calcium 8.9 - 10.3 mg/dL 8.6    ?Total Protein 6.5 - 8.1 g/dL 8.2    ?Total Bilirubin 0.3 - 1.2 mg/dL 0.7    ?Alkaline Phos 38 - 126 U/L 106    ?AST 15 - 41 U/L 58    ?ALT 0 - 44 U/L 38    ? ?Edinburgh Score: ? ?  09/29/2021  ?  3:16 PM  ?Edinburgh Postnatal Depression Scale Screening Tool  ?I have been able to laugh and see the funny side of things. 0  ?I have looked forward with enjoyment to  things. 0  ?I have blamed myself unnecessarily when things went wrong. 0  ?I have been anxious or worried for no good reason. 0  ?I have felt scared or panicky for no good reason. 0  ?Things have been getting on top of me. 1  ?I have been so unhappy that I have had difficulty sleeping. 1  ?I have felt sad or miserable. 0  ?I have been so unhappy that I have been crying. 0  ?The thought of harming myself has occurred to me. 0  ?Edinburgh Postnatal Depression Scale Total 2  ? ? ? ?After visit meds:  ?Allergies as of 09/30/2021   ?No Known Allergies ?  ? ?  ?Medication List  ?  ? ?TAKE these medications   ? ?ferrous sulfate 325 (65 FE) MG tablet ?Take 1 tablet (325 mg total) by mouth every other day. ?Start taking on: Oct 01, 2021 ?  ?ibuprofen 600 MG tablet ?Commonly known as: ADVIL ?Take 1 tablet (600 mg total) by mouth every 6 (six) hours. ?  ?PrePLUS 27-1 MG  Tabs ?Take 1 tablet by mouth daily. ?  ?valACYclovir 500 MG tablet ?Commonly known as: Valtrex ?Take 1 tablet (500 mg total) by mouth 2 (two) times daily. ?  ? ?  ? ? ? ?Discharge home in stable condition ?Infant Feeding: Bottle and Breast ?Infant Disposition:home with mother ?Discharge instruction: per After Visit Summary and Postpartum booklet. ?Activity: Advance as tolerated. Pelvic rest for 6 weeks.  ?Diet: routine diet ?Future Appointments: ?Future Appointments  ?Date Time Provider [place]  ?10/30/2021 10:15 AM Luvenia Redden, PA-C [facility]  ? ?Follow up Visit: ? ? ?Please schedule this patient for a Virtual postpartum visit in 4 weeks with the following provider: Any provider. ?Additional Postpartum F/U:  ?Low risk pregnancy complicated by:  ?Delivery mode:  Vaginal, Spontaneous  ?Anticipated Birth Control:  PP Nexplanon placed ? ? ?09/30/2021 ?Laury Deep, CNM ? ? ? ?

## 2021-09-28 NOTE — H&P (Signed)
OBSTETRIC ADMISSION HISTORY AND PHYSICAL ? ?Jesseca Marsch is a 26 y.o. female G3P2002 with IUP at [redacted]w[redacted]d by 8 week [place] presenting for contractions at term as well as decreased fetal movement . She reports, No LOF, no VB, no blurry vision, headaches or peripheral edema, and RUQ pain.  She plans on  breast feeding. She request inpatient Nexplanon for birth control. ? ?She received her prenatal care at  [facility]   ? ?Dating: By early [place] --->  Estimated Date of Delivery: 10/04/21 ? ?Sono:   ?@[redacted]w[redacted]d , CWD, normal anatomy, cephalic presentation, posterior placental lie, 2695g, 58% EFW ? ? ?Prenatal History/Complications:  ?Limited prenatal care ?Abnormal 1 hr GTT (did not get 2 hr GTT) ?HSV - declined valtrex-  last outbreak a few years ago ?Varicella non-immune ? ?Past Medical History: ?Past Medical History:  ?Diagnosis Date  ? Abdominal pain in pregnancy, antepartum   ? Asthma   ? Decreased fetal movement affecting management of mother, antepartum 10/07/2014  ? Equivocal NST in clinic, had 10x10 accels but not reactive by criteria. Consulted Dr 10/09/2014 who advises she go to MAU for BPP. Patient refuses due to transportation issues. States will come tomorrow.  Risks of fetal distress and still birth discussed.   ? Supervision of normal pregnancy in second trimester 06/17/2014  ?  Clinic  HR Clinic Prenatal Labs  Dating  6 wk ultrasound Blood type: O/POS/-- (05/19 1516)   Genetic Screen  too late, no visits after 1st until 32w Antibody:NEG (05/19 1516)  Anatomic [birth year]1975  Normal at 20 wks, but limited face, spine > rescan in 4-6 wks > not done (currently 39 wks) Rubella: 1.64 (05/19 1516)  GTT Early:               Third trimester: 96 RPR: NON REAC (05/19 1516)   Flu vaccine  Dec  ? Urinary tract infection   ? UTI (urinary tract infection)   ? ? ?Past Surgical History: ?Past Surgical History:  ?Procedure Laterality Date  ? NO PAST SURGERIES    ? ? ?Obstetrical History: ?OB History   ? ? Gravida  ?3  ? Para  ?2  ? Term  ?2  ?  Preterm  ?0  ? AB  ?0  ? Living  ?2  ?  ? ? SAB  ?0  ? IAB  ?0  ? Ectopic  ?0  ? Multiple  ?0  ? Live Births  ?2  ?   ?  ?  ? ? ?Social History ?Social History  ? ?Socioeconomic History  ? Marital status: Single  ?  Spouse name: Not on file  ? Number of children: Not on file  ? Years of education: Not on file  ? Highest education level: Not on file  ?Occupational History  ? Not on file  ?Tobacco Use  ? Smoking status: Some Days  ?  Years: 8.00  ?  Types: Cigarettes  ? Smokeless tobacco: Never  ?Vaping Use  ? Vaping Use: Never used  ?Substance and Sexual Activity  ? Alcohol use: Not Currently  ? Drug use: Not Currently  ?  Types: Marijuana  ?  Comment: about a month ago  ? Sexual activity: Yes  ?  Birth control/protection: None  ?Other Topics Concern  ? Not on file  ?Social History Narrative  ? Not on file  ? ?Social Determinants of Health  ? ?Financial Resource Strain: Not on file  ?Food Insecurity: Food Insecurity Present  ? Worried About Jan  of Food in the Last Year: Sometimes true  ? Ran Out of Food in the Last Year: Sometimes true  ?Transportation Needs: Unmet Transportation Needs  ? Lack of Transportation (Medical): Yes  ? Lack of Transportation (Non-Medical): Yes  ?Physical Activity: Not on file  ?Stress: Not on file  ?Social Connections: Not on file  ? ? ?Family History: ?Family History  ?Problem Relation Age of Onset  ? Hypertension Mother   ? Anemia Mother   ? ? ?Allergies: ?No Known Allergies ? ?Medications Prior to Admission  ?Medication Sig Dispense Refill Last Dose  ? Prenatal Vit-Fe Fumarate-FA (PREPLUS) 27-1 MG TABS Take 1 tablet by mouth daily. 30 tablet 13   ? valACYclovir (VALTREX) 500 MG tablet Take 1 tablet (500 mg total) by mouth 2 (two) times daily. (Patient not taking: Reported on 09/22/2021) 60 tablet 1   ? ? ? ?Review of Systems  ? ?All systems reviewed and negative except as stated in HPI ? ?Blood pressure 106/66, pulse 83, temperature (!) 97.4 ?F (36.3 ?C), temperature source Oral,  last menstrual period 12/01/2020, SpO2 100 %. ?General appearance: alert ?Lungs: clear to auscultation bilaterally ?Heart: regular rate and rhythm ?Abdomen: soft, non-tender; bowel sounds normal ?Extremities: Homans sign is negative, no sign of DVT ?Presentation: cephalic in MAU ?Fetal monitoringBaseline: 130 bpm, Variability: Good {> 6 bpm), Accelerations: Reactive, and Decelerations: Absent ?Uterine activity irregular ?Dilation: 1.5 ?Effacement (%): 50 ?Station: -3 ? ? ?Prenatal labs: ?ABO, Rh: O/Positive/-- (10/27 1331) ?Antibody: Negative (10/27 1331) ?Rubella: Immune (10/27 1331) ?RPR: Nonreactive (10/27 1331)  ?HBsAg: Negative (10/27 1331)  ?HIV: Non-reactive (10/27 1331)  ?GBS: Negative/-- (04/27 1028)  ?1 hr Glucola abnormal at 140, didn't get 2hr  ?Genetic screening  not done ?Anatomy US normal ? ?Prenatal Transfer Tool  ?Maternal Diabetes: 1 hr Glucola abnormal at 140, didn't get 2hr testing. Initial BG 111. Will recheck at 4 hours ?Genetic Screening: not done ?Maternal Ultrasounds/Referrals: Normal ?Fetal Ultrasounds or other Referrals:  None ?Maternal Substance Abuse:  Yes:  Type: Marijuana- reports use in the past month or so ?Significant Maternal Medications:  None ?Significant Maternal Lab Results: Group B Strep negative ? ?No results found for this or any previous visit (from the past 24 hour(s)). ? ?Patient Active Problem List  ? Diagnosis Date Noted  ? 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  ? Abnormal GTT (glucose tolerance test) 08/04/2021  ? Susceptible to varicella (non-immune), currently pregnant 06/09/2021  ? Supervision of high risk pregnancy, antepartum 06/08/2021  ? Limited prenatal care 06/08/2021  ? 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 ? ?Assessment/Plan:  ?Wyman Songsterieisha Lepage is a 26 y.o. G3P2002 at 6423w1d here forIOL for decreased fetal movement at term also with known limited prenatal care ? ?#Labor: cervix 1.5cm in MAU. Speculum exam without any lesions. Patient is eating now and then will plan to start with  cytotec buccal 50 mcg. ?#Pain:  Would like to try unmedicated ?#FWB: Cat I ?#ID:  GBS negative ?#MOF: plans breast ?#MOC:plans inpatient Nexplanon ?#Circ:  yes ? ?#HSV ?Patient with hx of HSV. Currently not on Valtrex (patient stated does not want to take as she feels it causes outbreaks). Denies any burning or other prodromal symptoms. Last outbreak few years ago. Speculum exam done at admission without any lesions and no external lesions. ? ?#Abnormal 1hr GTT ?140, didn't get 2hr as did not want to miss work. 4/13 US  EFW 58%ile. Normal AFI. ?Initial BG here 111. Will recheck in 4 hours.  ? ? ? ?Warner MccreedyAnuka Daysha Ashmore, MD  ?  09/28/2021, 10:24 AM ? ? ? ?

## 2021-09-29 LAB — CBC
HCT: 35.9 % — ABNORMAL LOW (ref 36.0–46.0)
Hemoglobin: 12.2 g/dL (ref 12.0–15.0)
MCH: 31.8 pg (ref 26.0–34.0)
MCHC: 34 g/dL (ref 30.0–36.0)
MCV: 93.5 fL (ref 80.0–100.0)
Platelets: 193 10*3/uL (ref 150–400)
RBC: 3.84 MIL/uL — ABNORMAL LOW (ref 3.87–5.11)
RDW: 14.8 % (ref 11.5–15.5)
WBC: 12.2 10*3/uL — ABNORMAL HIGH (ref 4.0–10.5)
nRBC: 0 % (ref 0.0–0.2)

## 2021-09-29 MED ORDER — WITCH HAZEL-GLYCERIN EX PADS: 1.0000 "application " | MEDICATED_PAD | CUTANEOUS | Status: DC | PRN

## 2021-09-29 MED ORDER — ONDANSETRON HCL 4 MG PO TABS
4.0000 mg | ORAL_TABLET | ORAL | Status: DC | PRN
Start: 2021-09-29 — End: 2021-09-30

## 2021-09-29 MED ORDER — OXYCODONE-ACETAMINOPHEN 5-325 MG PO TABS: 1.0000 | ORAL_TABLET | ORAL | Status: DC | PRN

## 2021-09-29 MED ORDER — METHYLERGONOVINE MALEATE 0.2 MG PO TABS: 0.2000 mg | ORAL_TABLET | ORAL | Status: DC | PRN

## 2021-09-29 MED ORDER — METHYLERGONOVINE MALEATE 0.2 MG/ML IJ SOLN: 0.2000 mg | INTRAMUSCULAR | Status: DC | PRN

## 2021-09-29 MED ORDER — ONDANSETRON HCL 4 MG/2ML IJ SOLN: 4.0000 mg | INTRAMUSCULAR | Status: DC | PRN

## 2021-09-29 MED ORDER — OXYCODONE-ACETAMINOPHEN 5-325 MG PO TABS: 2.0000 | ORAL_TABLET | ORAL | Status: DC | PRN

## 2021-09-29 MED ORDER — DIBUCAINE (PERIANAL) 1 % EX OINT: 1.0000 "application " | TOPICAL_OINTMENT | CUTANEOUS | Status: DC | PRN

## 2021-09-29 NOTE — Lactation Note (Signed)
This note was copied from a baby's chart. ?Lactation Consultation Note ? ?Patient Name: Ashley Morris ?Today's Date: 09/29/2021 ?Reason for consult: Term;Initial assessment ?Age:26 hours ? ?LC in to visit with P3 Mom of term baby.  Baby at 3% weight loss.   Mom states baby has been latching well.  Latch score of 8 given by her RN. ?Baby getting her hearing screen currently.   ?Encouraged keeping baby STS and offering the breast with feeding cues. ?Mom's first experience with breastfeeding her first 2 ("when she was younger") was poor and she doesn't want to give up with this baby.  ?Mom aware of IP and OP lactation support available to her and encouraged her to call prn for assistance. ? ?Maternal Data ?Has patient been taught Hand Expression?: No ?Does the patient have breastfeeding experience prior to this delivery?: Yes ?How long did the patient breastfeed?: 1 month ?Consult Status ?Consult Status: Follow-up ?Date: 09/30/21 ?Follow-up type: In-patient ? ? ? ?[name] E ?09/29/2021, 9:43 AM ? ? ? ?
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
## 2021-09-29 NOTE — Progress Notes (Signed)
Post Partum Day 1 ?Subjective: ?no complaints, up ad lib, voiding and tolerating PO, small lochia, plans to breastfeed,  plans inpt nexplanon ? ?Objective: ?Blood pressure 111/77, pulse 77, temperature 98.3 ?F (36.8 ?C), temperature source Oral, resp. rate 18, last menstrual period 12/01/2020, SpO2 100 %, unknown if currently breastfeeding. ? ?Physical Exam:  ?General: alert, cooperative and no distress ?Lochia:normal flow ?Chest: CTAB ?Heart: RRR no m/r/g ?Abdomen: +BS, soft, nontender,  ?Uterine Fundus: firm ?DVT Evaluation: No evidence of DVT seen on physical exam. ?Extremities: no edema ? ?Recent Labs  ?  09/28/21 ?1039 09/29/21 ?0507  ?HGB 12.1 12.2  ?HCT 35.6* 35.9*  ? ? ?Assessment/Plan: ?Plan for discharge tomorrow and Contraception nexplanon ? ? LOS: 1 day  ? ?Ashley Morris ?09/29/2021, 8:05 AM  ? ?

## 2021-09-30 DIAGNOSIS — Z30017 Encounter for initial prescription of implantable subdermal contraceptive: Secondary | ICD-10-CM | POA: Diagnosis not present

## 2021-09-30 MED ORDER — IBUPROFEN 600 MG PO TABS
600.0000 mg | ORAL_TABLET | Freq: Four times a day (QID) | ORAL | 0 refills | Status: DC
Start: 2021-09-30 — End: 2022-11-05

## 2021-09-30 MED ORDER — FERROUS SULFATE 325 (65 FE) MG PO TABS: 325.0000 mg | ORAL_TABLET | ORAL | 3 refills | Status: DC

## 2021-09-30 MED ORDER — LIDOCAINE HCL 1 % IJ SOLN
0.0000 mL | Freq: Once | INTRAMUSCULAR | Status: AC | PRN
  Administered 2021-09-30: 20 mL via INTRADERMAL
  Filled 2021-09-30: qty 20

## 2021-09-30 MED ORDER — ETONOGESTREL 68 MG ~~LOC~~ IMPL
68.0000 mg | DRUG_IMPLANT | Freq: Once | SUBCUTANEOUS | Status: AC
  Administered 2021-09-30: 68 mg via SUBCUTANEOUS
  Filled 2021-09-30: qty 1

## 2021-09-30 NOTE — Procedures (Signed)
?  Procedure Note: Nexplanon insertion ? ?Patient is a 26 y.o. G3P3003 now PPD# 2 from [facility]. She desires long-term reversible contraception. ? ?Risks/benefits/side effects of Nexplanon have been discussed with patient and her questions have been answered. Patient is aware of the common side effect of irregular bleeding, which the incidence of decreases over time. ? ?BP 120/82   Pulse 76   Temp 98.4 ?F (36.9 ?C) (Oral)   Resp 18   LMP 12/01/2020 (Approximate)   SpO2 100%   Breastfeeding Unknown  ? ?No results found for this or any previous visit (from the past 24 hour(s)).  ? ?She is left-handed, so her right arm, approximately 10 cm proximal from the elbow, was cleansed with alcohol and anesthetized with 2 mL of 1% Lidocaine with Epi.  The area was cleansed again with betadine and the Nexplanon was inserted per manufacturer's recommendations without difficulty.  A pressure bandage were applied. ? ?Pt was instructed to keep the area clean and dry, remove pressure bandage in 24 hours.  She was given a card indicating date Nexplanon was inserted and date it needs to be removed. Follow-up PRN problems. ? ?Myna Hidalgo, DO ?Attending Obstetrician & Gynecologist, Faculty Practice ?Center for [employer], [facility] ? ? ?  ?

## 2021-09-30 NOTE — Progress Notes (Addendum)
[facility] ? ?09/30/2021 ? ?Ashley Morris ?09/15/1995 ?[id number] ? ? ?Current Barriers:  ?Umbilical Cord Tissue Drug Screen Results:  Still pending as of 4:40 pm on 09/30/2021. ?Newborn Rapid Urine Drug Screen Results:  None detected. ?MOB "admitted to smoking marijuana during pregnancy, to increase appetite". ?Unmet substance treatment need. ?MOB needs support, education, and care coordination, in order to meet this unmet need. ?Stage of Change: Denies need for counseling and/or a referral for substance abuse treatment services. ?Clinical Goal(s):  ?MOB will reduce self-medicating behavior and increase ability of: Coping skills, healthy habits, and self-management skills.   ?MOB verbalizes understanding of plan for management of Tetrahydrocannabinol. ?Explored community resource options for unmet needs related to Tetrahydrocannabinol. ?MOB verbalized basic understanding of Tetrahydrocannabinol.   ?MOB will attend all scheduled medical appointments, as evidenced by patient report and care team review of appointment completion in EMR. ?Interventions:  ?Collaboration with Attending Physician, via Epic note.   ?Inter-disciplinary care team collaboration, via Epic note. ?Assessed MOB's understanding of Tetrahydrocannabinol use, previous treatment, and what they would like to change. ?Assessed MOB's overall care coordination needs and provided basic substance abuse education.  ?Assessed for Suicidal Ideation/Homicidal Ideation:  None present. ?Completed screening, brief Intervention, and encouraged referral for substance abuse treatment, as well as support group involvement. ?Substance use treatment information provided. ?Explained to [facility] referral placement to Child Protective Services, with the [facility] of Social Services, per hospital drug screen policy, on 09/30/2021, if CDS positive for Tetrahydrocannabinol. ?Solution-Focused Strategies employed. ?Active listening /  Reflection utilized.  ?Emotional Support provided. ?Participation in substance abuse counseling encouraged. ?Participation in substance abuse support group encouraged.  ?MOB verbalizes understanding of hospital drug screen policy for reporting drug exposed infants/newborns. ?Patient Goals:  ?Implement interventions discussed today. ?Refrain from Tetrahydrocannabinol use/abuse. ?Refrain from exposing infant/newborn to Tetrahydrocannabinol. ? ?Danford Bad, BSW, MSW, LCSW  ?Licensed Clinical Social Worker  ?[facility]  ?Mailing Address-[address] [place], [place] [address] ?Physical Address-[address] [place], [place], [place] [address] ?Toll Free Main # [phone] ?Fax # [phone] ?Cell # [phone] ?[email or web address] ?

## 2021-10-02 ENCOUNTER — Encounter: Payer: Medicaid Other | Admitting: Medical

## 2021-10-05 ENCOUNTER — Other Ambulatory Visit: Payer: Medicaid Other

## 2021-10-09 ENCOUNTER — Telehealth (HOSPITAL_COMMUNITY): Payer: Self-pay | Admitting: *Deleted

## 2021-10-09 NOTE — Telephone Encounter (Signed)
Left phone voicemail message.  Duffy Rhody, RN 10-09-2021 at 9:23am

## 2021-10-12 ENCOUNTER — Other Ambulatory Visit: Payer: Medicaid Other

## 2021-10-12 ENCOUNTER — Encounter: Payer: Medicaid Other | Admitting: Family Medicine

## 2021-10-30 ENCOUNTER — Telehealth: Payer: Self-pay

## 2021-10-30 ENCOUNTER — Telehealth: Payer: Medicaid Other | Admitting: Medical

## 2021-10-30 NOTE — Telephone Encounter (Signed)
2nd attempt for My Chart visit, still no answer.

## 2022-03-09 ENCOUNTER — Encounter: Payer: Self-pay | Admitting: Family Medicine

## 2022-03-09 ENCOUNTER — Ambulatory Visit: Payer: Medicaid Other | Admitting: Family Medicine

## 2022-03-09 NOTE — Progress Notes (Signed)
Patient did not keep appointment today. She will be called to reschedule.  

## 2022-06-20 ENCOUNTER — Encounter (HOSPITAL_COMMUNITY): Payer: Self-pay

## 2022-06-20 ENCOUNTER — Ambulatory Visit (HOSPITAL_COMMUNITY)
Admission: EM | Admit: 2022-06-20 | Discharge: 2022-06-20 | Disposition: A | Discharge status: Home / Self Care | Payer: Medicaid Other | Attending: Internal Medicine | Admitting: Internal Medicine

## 2022-06-20 DIAGNOSIS — N898 Other specified noninflammatory disorders of vagina: Secondary | ICD-10-CM | POA: Diagnosis present

## 2022-06-20 DIAGNOSIS — Z711 Person with feared health complaint in whom no diagnosis is made: Secondary | ICD-10-CM | POA: Insufficient documentation

## 2022-06-20 LAB — RPR: RPR Ser Ql: NONREACTIVE

## 2022-06-20 LAB — HEPATITIS C ANTIBODY: HCV Ab: NONREACTIVE

## 2022-06-20 LAB — HIV ANTIBODY (ROUTINE TESTING W REFLEX): HIV Screen 4th Generation wRfx: NONREACTIVE

## 2022-06-20 NOTE — ED Triage Notes (Signed)
Chief Complaint: Patient having vaginal discharge that is brown. No itching or odor. Gave birth in May. Recently ended period. Wanting std testing as well.   Onset: today   Prescriptions or OTC medications tried: No

## 2022-06-20 NOTE — Discharge Instructions (Signed)
Will call you if tests are positive and will advise you on treatment if necessary  Have a great day!

## 2022-06-20 NOTE — ED Provider Notes (Signed)
[place]   [id number] 06/20/22 Arrival Time: 9233  ASSESSMENT & PLAN:  1. Vaginal discharge   2. Concern about STD in female without diagnosis    -Patient has few days of brown vaginal discharge after completing her period.  Reassurance was provided that this is likely physiologic.  Will be thorough to test for BV/yeast as other alternative causes for discharge.  Will also test her for STDs today as desired.  Will call her if results are positive and treat as indicated.  All questions were answered and she agrees to plan.  This is an acute uncomplicated problem and I ordered 3+ unique tests  No orders of the defined types were placed in this encounter.  Discharge Instructions   None       Reviewed expectations re: course of current medical issues. Questions answered. Outlined signs and symptoms indicating need for more acute intervention. Patient verbalized understanding. After Visit Summary given.   SUBJECTIVE: Very pleasant 27 year old female comes to clinic to be evaluated for vaginal discharge and STD testing.  She yesterday noticed some brown vaginal discharge with wiping on the toilet.  She recently completed her period last week.  No new soaps or detergents.  She denies any foul smell.  Denied any itching.  Denies any abdominal pain or fevers.  She would also like STD testing today.  She has had unprotected sex recently.  She does use Nexplanon for birth control.  And as above she just finished her period a few days ago.   Patient's last menstrual period was 06/14/2022 (approximate). Past Surgical History:  Procedure Laterality Date   NO PAST SURGERIES       OBJECTIVE:  Vitals:   06/20/22 0926 06/20/22 0927  BP:  118/70  Pulse:  91  Resp:  16  Temp:  98.4 F (36.9 C)  TempSrc:  Oral  SpO2:  97%  Weight: 86.2 kg   Height: 5\' 7"  (1.702 m)      Physical Exam Vitals and nursing note reviewed.  Constitutional:      General: She is not in  acute distress.    Appearance: Normal appearance.  HENT:     Head: Normocephalic.  Cardiovascular:     Rate and Rhythm: Normal rate.  Pulmonary:     Effort: Pulmonary effort is normal.  Abdominal:     General: Abdomen is flat. Bowel sounds are normal. There is no distension.     Palpations: Abdomen is soft.     Tenderness: There is no abdominal tenderness. There is no guarding or rebound.  Musculoskeletal:        General: Normal range of motion.  Neurological:     General: No focal deficit present.     Mental Status: She is alert.  Psychiatric:        Mood and Affect: Mood normal.      Labs: Results for orders placed or performed during the hospital encounter of 09/28/21  CBC  Result Value Ref Range   WBC 7.4 4.0 - 10.5 K/uL   RBC 3.78 (L) 3.87 - 5.11 MIL/uL   Hemoglobin 12.1 12.0 - 15.0 g/dL   HCT 35.6 (L) 36.0 - 46.0 %   MCV 94.2 80.0 - 100.0 fL   MCH 32.0 26.0 - 34.0 pg   MCHC 34.0 30.0 - 36.0 g/dL   RDW 15.1 11.5 - 15.5 %   Platelets 197 150 - 400 K/uL   nRBC 0.0 0.0 - 0.2 %  RPR  Result Value  Ref Range   RPR Ser Ql NON REACTIVE NON REACTIVE  Glucose, capillary  Result Value Ref Range   Glucose-Capillary 111 (H) 70 - 99 mg/dL  HIV Antibody (routine testing w rflx)  Result Value Ref Range   HIV Screen 4th Generation wRfx Non Reactive Non Reactive  Glucose, capillary  Result Value Ref Range   Glucose-Capillary 72 70 - 99 mg/dL  CBC  Result Value Ref Range   WBC 12.2 (H) 4.0 - 10.5 K/uL   RBC 3.84 (L) 3.87 - 5.11 MIL/uL   Hemoglobin 12.2 12.0 - 15.0 g/dL   HCT 35.9 (L) 36.0 - 46.0 %   MCV 93.5 80.0 - 100.0 fL   MCH 31.8 26.0 - 34.0 pg   MCHC 34.0 30.0 - 36.0 g/dL   RDW 14.8 11.5 - 15.5 %   Platelets 193 150 - 400 K/uL   nRBC 0.0 0.0 - 0.2 %  Type and screen [place]  Result Value Ref Range   ABO/RH(D) O POS    Antibody Screen NEG    Sample Expiration      10/01/2021,2359 Performed at [facility], [address],  [place], [place] [address]    Labs Reviewed  HIV ANTIBODY (ROUTINE TESTING W REFLEX)  RPR  HEPATITIS C ANTIBODY  CERVICOVAGINAL ANCILLARY ONLY    Imaging: No results found.   No Known Allergies                                             Past Medical History:  Diagnosis Date   Abdominal pain in pregnancy, antepartum    Asthma    Decreased fetal movement affecting management of mother, antepartum 10/07/2014   Equivocal NST in clinic, had 10x10 accels but not reactive by criteria. Consulted Dr Roselie Awkward who advises she go to [facility] for [facility]. Patient refuses due to transportation issues. States will come tomorrow.  Risks of fetal distress and still birth discussed.    Supervision of normal pregnancy in second trimester 06/17/2014    [facility] Prenatal Labs  Dating  6 wk ultrasound Blood type: O/POS/-- (05/19 1516)   Genetic Screen  too late, no visits after 1st until 32w Antibody:NEG (05/19 1516)  Anatomic [place]  Normal at 20 wks, but limited face, spine > rescan in 4-6 wks > not done (currently 39 wks) Rubella: 1.64 (05/19 1516)  GTT Early:               Third trimester: 96 RPR: NON REAC (05/19 1516)   Flu vaccine  Dec   Urinary tract infection    UTI (urinary tract infection)     Social History   Socioeconomic History   Marital status: Single    Spouse name: Not on file   Number of children: Not on file   Years of education: Not on file   Highest education level: Not on file  Occupational History   Not on file  Tobacco Use   Smoking status: Some Days    Years: 8.00    Types: Cigarettes   Smokeless tobacco: Never  Vaping Use   Vaping Use: Never used  Substance and Sexual Activity   Alcohol use: Not Currently   Drug use: Yes    Types: Marijuana    Comment: about a month ago   Sexual activity: Yes    Birth  control/protection: None  Other Topics Concern   Not on file  Social History Narrative   Not on file   Social Determinants of Health   Financial Resource Strain: Not on  file  Food Insecurity: Food Insecurity Present (09/14/2021)   Hunger Vital Sign    Worried About Running Out of Food in the Last Year: Sometimes true    Ran Out of Food in the Last Year: Sometimes true  Transportation Needs: Unmet Transportation Needs (09/14/2021)   PRAPARE - Hydrologist (Medical): Yes    Lack of Transportation (Non-Medical): Yes  Physical Activity: Not on file  Stress: Not on file  Social Connections: Not on file  Intimate Partner Violence: Not on file    Family History  Problem Relation Age of Onset   Hypertension Mother    Anemia Mother       Dortha Kern, MD 06/20/22 [phone]

## 2022-06-21 ENCOUNTER — Telehealth (HOSPITAL_COMMUNITY): Payer: Self-pay | Admitting: Emergency Medicine

## 2022-06-21 LAB — CERVICOVAGINAL ANCILLARY ONLY
Bacterial Vaginitis (gardnerella): NEGATIVE
Candida Glabrata: NEGATIVE
Candida Vaginitis: POSITIVE — AB
Chlamydia: NEGATIVE
Comment: NEGATIVE
Comment: NEGATIVE
Comment: NEGATIVE
Comment: NEGATIVE
Comment: NEGATIVE
Comment: NORMAL
Neisseria Gonorrhea: NEGATIVE
Trichomonas: NEGATIVE

## 2022-06-21 MED ORDER — FLUCONAZOLE 150 MG PO TABS: 150.0000 mg | ORAL_TABLET | Freq: Once | ORAL | 0 refills | Status: AC

## 2022-07-21 ENCOUNTER — Encounter (HOSPITAL_COMMUNITY): Payer: Self-pay

## 2022-07-21 ENCOUNTER — Other Ambulatory Visit: Payer: Self-pay

## 2022-07-21 ENCOUNTER — Emergency Department (HOSPITAL_COMMUNITY)
Admission: EM | Admit: 2022-07-21 | Discharge: 2022-07-22 | Disposition: A | Discharge status: Home / Self Care | Payer: Medicaid Other | Attending: Emergency Medicine | Admitting: Emergency Medicine

## 2022-07-21 DIAGNOSIS — J45909 Unspecified asthma, uncomplicated: Secondary | ICD-10-CM | POA: Insufficient documentation

## 2022-07-21 DIAGNOSIS — R112 Nausea with vomiting, unspecified: Secondary | ICD-10-CM

## 2022-07-21 DIAGNOSIS — K2920 Alcoholic gastritis without bleeding: Secondary | ICD-10-CM | POA: Insufficient documentation

## 2022-07-21 DIAGNOSIS — E876 Hypokalemia: Secondary | ICD-10-CM | POA: Insufficient documentation

## 2022-07-21 DIAGNOSIS — E860 Dehydration: Secondary | ICD-10-CM | POA: Diagnosis not present

## 2022-07-21 LAB — URINALYSIS, ROUTINE W REFLEX MICROSCOPIC
Bilirubin Urine: NEGATIVE
Glucose, UA: NEGATIVE mg/dL
Ketones, ur: 20 mg/dL — AB
Nitrite: NEGATIVE
Protein, ur: 100 mg/dL — AB
Specific Gravity, Urine: 1.03 (ref 1.005–1.030)
WBC, UA: >50 WBC/hpf (ref 0–5)
pH: 6 (ref 5.0–8.0)

## 2022-07-21 LAB — COMPREHENSIVE METABOLIC PANEL
ALT: 22 U/L (ref 0–44)
AST: 26 U/L (ref 15–41)
Albumin: 4.2 g/dL (ref 3.5–5.0)
Alkaline Phosphatase: 62 U/L (ref 38–126)
Anion gap: 13 (ref 5–15)
BUN: 16 mg/dL (ref 6–20)
CO2: 22 mmol/L (ref 22–32)
Calcium: 9.4 mg/dL (ref 8.9–10.3)
Chloride: 99 mmol/L (ref 98–111)
Creatinine, Ser: 0.96 mg/dL (ref 0.44–1.00)
GFR, Estimated: >60 mL/min (ref >60)
Glucose, Bld: 121 mg/dL — ABNORMAL HIGH (ref 70–99)
Potassium: 3 mmol/L — ABNORMAL LOW (ref 3.5–5.1)
Sodium: 134 mmol/L — ABNORMAL LOW (ref 135–145)
Total Bilirubin: 0.5 mg/dL (ref 0.3–1.2)
Total Protein: 9.1 g/dL — ABNORMAL HIGH (ref 6.5–8.1)

## 2022-07-21 LAB — CBC
HCT: 47.5 % — ABNORMAL HIGH (ref 36.0–46.0)
Hemoglobin: 16.5 g/dL — ABNORMAL HIGH (ref 12.0–15.0)
MCH: 31.1 pg (ref 26.0–34.0)
MCHC: 34.7 g/dL (ref 30.0–36.0)
MCV: 89.5 fL (ref 80.0–100.0)
Platelets: 257 10*3/uL (ref 150–400)
RBC: 5.31 MIL/uL — ABNORMAL HIGH (ref 3.87–5.11)
RDW: 12.6 % (ref 11.5–15.5)
WBC: 6.6 10*3/uL (ref 4.0–10.5)
nRBC: 0 % (ref 0.0–0.2)

## 2022-07-21 LAB — I-STAT BETA HCG BLOOD, ED (MC, WL, AP ONLY): I-stat hCG, quantitative: <5 m[IU]/mL (ref <5)

## 2022-07-21 LAB — ETHANOL: Alcohol, Ethyl (B): <10 mg/dL (ref <10)

## 2022-07-21 LAB — LIPASE, BLOOD: Lipase: 47 U/L (ref 11–51)

## 2022-07-21 MED ORDER — PANTOPRAZOLE SODIUM 40 MG IV SOLR
40.0000 mg | Freq: Once | INTRAVENOUS | Status: AC
  Administered 2022-07-21: 40 mg via INTRAVENOUS
  Filled 2022-07-21: qty 10

## 2022-07-21 MED ORDER — ONDANSETRON HCL 4 MG PO TABS: 4.0000 mg | ORAL_TABLET | Freq: Four times a day (QID) | ORAL | 0 refills | Status: DC

## 2022-07-21 MED ORDER — ONDANSETRON HCL 4 MG/2ML IJ SOLN
4.0000 mg | Freq: Once | INTRAMUSCULAR | Status: AC
  Administered 2022-07-21: 4 mg via INTRAVENOUS
  Filled 2022-07-21: qty 2

## 2022-07-21 MED ORDER — PANTOPRAZOLE SODIUM 40 MG PO TBEC: 40.0000 mg | DELAYED_RELEASE_TABLET | Freq: Every day | ORAL | 0 refills | Status: DC

## 2022-07-21 MED ORDER — SODIUM CHLORIDE 0.9 % IV BOLUS
1000.0000 mL | Freq: Once | INTRAVENOUS | Status: AC
  Administered 2022-07-21: 1000 mL via INTRAVENOUS

## 2022-07-21 MED ORDER — POTASSIUM CHLORIDE CRYS ER 20 MEQ PO TBCR
40.0000 meq | EXTENDED_RELEASE_TABLET | Freq: Once | ORAL | Status: AC
  Administered 2022-07-22: 40 meq via ORAL
  Filled 2022-07-21: qty 2

## 2022-07-21 NOTE — ED Triage Notes (Signed)
Pt BIB EMS from home for vomiting since Thursday night after having alcohol.  Cbg 137

## 2022-07-21 NOTE — ED Provider Notes (Signed)
[facility] Provider Note   CSN: [id number] Arrival date & time: 07/21/22  2205     History  Chief Complaint  Patient presents with   Emesis    Ashley Morris is a 27 y.o. female with past medical history significant for asthma who presents with concern for nausea, vomiting, diarrhea since Thursday.  Patient reports that she was drinking around a pint of Lunazul day prior to that for several months.  Patient thinks that she has alcohol poisoning/her alcohol abuse is catching up to her.  She reports greater than 10 episodes of emesis and diarrhea a day. She denies any hematochezia, hematemesis. She endorses seldom marjuana use, denies any alcohol use since Thursday.   Emesis      Home Medications Prior to Admission medications   Medication Sig Start Date End Date Taking? Authorizing Provider  ferrous sulfate 325 (65 FE) MG tablet Take 1 tablet (325 mg total) by mouth every other day. 10/01/21   Laury Deep, CNM  ibuprofen (ADVIL) 600 MG tablet Take 1 tablet (600 mg total) by mouth every 6 (six) hours. 09/30/21   Laury Deep, CNM  ondansetron (ZOFRAN) 4 MG tablet Take 1 tablet (4 mg total) by mouth every 6 (six) hours. 07/21/22  Yes Laylanie Kruczek H, PA-C  pantoprazole (PROTONIX) 40 MG tablet Take 1 tablet (40 mg total) by mouth daily. 07/21/22  Yes Ransom Nickson H, PA-C  Prenatal Vit-Fe Fumarate-FA (PREPLUS) 27-1 MG TABS Take 1 tablet by mouth daily. 04/30/21   Darlina Rumpf, CNM  valACYclovir (VALTREX) 500 MG tablet Take 1 tablet (500 mg total) by mouth 2 (two) times daily. Patient not taking: Reported on 09/22/2021 09/14/21   Luvenia Redden, PA-C      Allergies    Patient has no known allergies.    Review of Systems   Review of Systems  Gastrointestinal:  Positive for vomiting.  All other systems reviewed and are negative.   Physical Exam Updated Vital Signs BP (!) 114/55 (BP Location: Right Arm)   Pulse 62    Temp 97.8 F (36.6 C) (Oral)   Resp 18   Ht '5\' 7"'$  (1.702 m)   Wt 88.5 kg   SpO2 100%   BMI 30.54 kg/m  Physical Exam Vitals and nursing note reviewed.  Constitutional:      General: She is not in acute distress.    Appearance: Normal appearance.  HENT:     Head: Normocephalic and atraumatic.     Mouth/Throat:     Mouth: Mucous membranes are dry.  Eyes:     General:        Right eye: No discharge.        Left eye: No discharge.  Cardiovascular:     Rate and Rhythm: Normal rate and regular rhythm.     Heart sounds: No murmur heard.    No friction rub. No gallop.  Pulmonary:     Effort: Pulmonary effort is normal.     Breath sounds: Normal breath sounds.  Abdominal:     General: Bowel sounds are normal.     Palpations: Abdomen is soft.     Comments: Mild tenderness to palpation of abdomen generally, no rebound, rigidity, guarding  Skin:    General: Skin is warm and dry.     Capillary Refill: Capillary refill takes less than 2 seconds.  Neurological:     Mental Status: She is alert and oriented to person, place, and time.  Psychiatric:        Mood and Affect: Mood normal.        Behavior: Behavior normal.     ED Results / Procedures / Treatments   Labs (all labs ordered are listed, but only abnormal results are displayed) Labs Reviewed  COMPREHENSIVE METABOLIC PANEL - Abnormal; Notable for the following components:      Result Value   Sodium 134 (*)    Potassium 3.0 (*)    Glucose, Bld 121 (*)    Total Protein 9.1 (*)    All other components within normal limits  CBC - Abnormal; Notable for the following components:   RBC 5.31 (*)    Hemoglobin 16.5 (*)    HCT 47.5 (*)    All other components within normal limits  URINALYSIS, ROUTINE W REFLEX MICROSCOPIC - Abnormal; Notable for the following components:   Color, Urine AMBER (*)    APPearance HAZY (*)    Hgb urine dipstick SMALL (*)    Ketones, ur 20 (*)    Protein, ur 100 (*)    Leukocytes,Ua SMALL (*)     Bacteria, UA RARE (*)    Non Squamous Epithelial 0-5 (*)    All other components within normal limits  LIPASE, BLOOD  ETHANOL  I-STAT BETA HCG BLOOD, ED ([facility], [facility], [facility] ONLY)    EKG None  Radiology No results found.  Procedures Procedures    Medications Ordered in ED Medications  potassium chloride SA (KLOR-CON M) CR tablet 40 mEq (has no administration in time range)  sodium chloride 0.9 % bolus 1,000 mL (1,000 mLs Intravenous Bolus 07/21/22 2254)  pantoprazole (PROTONIX) injection 40 mg (40 mg Intravenous Given 07/21/22 2253)  ondansetron (ZOFRAN) injection 4 mg (4 mg Intravenous Given 07/21/22 2253)    ED Course/ Medical Decision Making/ A&P                             Medical Decision Making Amount and/or Complexity of Data Reviewed Labs: ordered.  Risk Prescription drug management.   This patient is a 27 y.o. female  who presents to the ED for concern of abdominal pain, nausea, vomiting.   Differential diagnoses prior to evaluation: The emergent differential diagnosis includes, but is not limited to,  esophagitis, gastritis, peptic ulcer disease, esophageal rupture, gastric rupture, Boerhaave's, Mallory-Weiss, pancreatitis, cholecystitis, cholangitis, acute mesenteric ischemia, atypical chest pain or ACS, lower lobar pneumonia versus other . This is not an exhaustive differential.   Past Medical History / Co-morbidities: Previous history of asthma, patient endorses heavy alcohol use  Additional history: Chart reviewed. Pertinent results include: Reviewed some outpatient family medicine visits, overall otherwise without any significant previous emergency department visits or lab work to compare  Physical Exam: Physical exam performed. The pertinent findings include: Patient with some generalized abdominal pain, mild tenderness to palpation, no rebound, rigidity, guarding, overall stable vital signs, she has somewhat soft diastolic blood pressure, [id number], otherwise  without fever, normal pulse rate, respiratory rate, stable oxygen saturation on room air.  Lab Tests/Imaging studies: I personally interpreted labs/imaging and the pertinent results include: UA with some ketones, protein, some white blood cells and red blood cells, no significant squamous cells, no nitrates, small leukocytes, patient without any dysuria could be representative early urinary tract infection, think is reasonable to not treat at this time without any dysuria symptoms.  CBC with elevated hemoglobin suggestive of mild hemoconcentration.  CMP with mild hypokalemia, we will  orally replete.  Lipase unremarkable, ethanol level negative at this time.   Medications: I ordered medication including fluids, Protonix, Zofran for nausea, vomiting, dehydration.  I have reviewed the patients home medicines and have made adjustments as needed.   Disposition: After consideration of the diagnostic results and the patients response to treatment, I feel that patient with findings consistent with dehydration, alcoholic gastritis.  Will discharge with Protonix, encourage Maalox and Mylanta, GI follow-up, and alcohol cessation.   Final Clinical Impression(s) / ED Diagnoses Final diagnoses:  Nausea and vomiting, unspecified vomiting type  Dehydration  Acute alcoholic gastritis, presence of bleeding unspecified  Hypokalemia    Rx / DC Orders ED Discharge Orders          Ordered    pantoprazole (PROTONIX) 40 MG tablet  Daily        07/21/22 2352    ondansetron (ZOFRAN) 4 MG tablet  Every 6 hours        07/21/22 2352              Anselmo Pickler, PA-C 07/21/22 2359    Valarie Merino, MD 07/22/22 0008

## 2022-11-05 ENCOUNTER — Ambulatory Visit (HOSPITAL_COMMUNITY)
Admission: EM | Admit: 2022-11-05 | Discharge: 2022-11-05 | Disposition: A | Discharge status: Home / Self Care | Payer: Medicaid Other | Attending: Family Medicine | Admitting: Family Medicine

## 2022-11-05 ENCOUNTER — Encounter (HOSPITAL_COMMUNITY): Payer: Self-pay

## 2022-11-05 DIAGNOSIS — Z3202 Encounter for pregnancy test, result negative: Secondary | ICD-10-CM

## 2022-11-05 DIAGNOSIS — R21 Rash and other nonspecific skin eruption: Secondary | ICD-10-CM | POA: Diagnosis not present

## 2022-11-05 LAB — POCT URINE PREGNANCY: Preg Test, Ur: NEGATIVE

## 2022-11-05 MED ORDER — TRIAMCINOLONE ACETONIDE 0.1 % EX OINT: 1.0000 | TOPICAL_OINTMENT | Freq: Two times a day (BID) | CUTANEOUS | 1 refills | Status: DC

## 2022-11-05 NOTE — ED Triage Notes (Signed)
Onset; Pt reports blister on legs and feet for several months.  Pt request pregnancy test. Last cycle was 10/08/2022.

## 2022-11-05 NOTE — ED Provider Notes (Signed)
[facility]    CSN: [id number] Arrival date & time: 11/05/22  1019    HISTORY   Chief Complaint  Patient presents with   Rash   Possible Pregnancy   HPI Ashley Morris is a pleasant, 27 y.o. female who presents to urgent care today. Onset; Pt reports blister on legs and feet for several months.  Pt request pregnancy test. Last cycle was 10/08/2022.    Rash Possible Pregnancy   Past Medical History:  Diagnosis Date   Abdominal pain in pregnancy, antepartum    Asthma    Decreased fetal movement affecting management of mother, antepartum 10/07/2014   Equivocal NST in clinic, had 10x10 accels but not reactive by criteria. Consulted Dr Debroah Loop who advises she go to [facility] for [facility]PP. Patient refuses due to transportation issues. States will come tomorrow.  Risks of fetal distress and still birth discussed.    Supervision of normal pregnancy in second trimester 06/17/2014    Clinic  [facility] Prenatal Labs  Dating  6 wk ultrasound Blood type: O/POS/-- (05/19 1516)   Genetic Screen  too late, no visits after 1st until 32w Antibody:NEG (05/19 1516)  Anatomic [place]  Normal at 20 wks, but limited face, spine > rescan in 4-6 wks > not done (currently 39 wks) Rubella: 1.64 (05/19 1516)  GTT Early:               Third trimester: 96 RPR: NON REAC (05/19 1516)   Flu vaccine  Dec   Urinary tract infection    UTI (urinary tract infection)    Patient Active Problem List   Diagnosis Date Noted   Normal labor 09/28/2021   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 Authorizing Provider  ferrous sulfate 325 (65 FE) MG tablet Take 1 tablet (325 mg total) by mouth every other day. 10/01/21   Raelyn Mora, CNM  ibuprofen (ADVIL) 600 MG tablet Take 1 tablet (600 mg total) by mouth every 6 (six) hours. 09/30/21   Raelyn Mora, CNM  ondansetron (ZOFRAN) 4 MG tablet Take 1 tablet (4 mg total) by mouth every 6 (six) hours. 07/21/22   Prosperi, Christian H, PA-C  pantoprazole (PROTONIX) 40 MG tablet Take 1 tablet (40 mg total) by mouth daily. 07/21/22   Prosperi, Christian H, PA-C  Prenatal Vit-Fe Fumarate-FA (PREPLUS) 27-1 MG TABS Take 1 tablet by mouth daily. 04/30/21   Calvert Cantor, CNM  valACYclovir (VALTREX) 500 MG tablet Take 1 tablet (500 mg total) by mouth 2 (two) times daily. Patient not taking: Reported on 09/22/2021 09/14/21   Marny Lowenstein, PA-C    Family History Family History  Problem Relation Age of Onset  Hypertension Mother    Anemia Mother    Social History Social History   Tobacco Use   Smoking status: Some Days    Years: 8    Types: Cigarettes   Smokeless tobacco: Never  Vaping Use   Vaping Use: Never used  Substance Use Topics   Alcohol use: Not Currently   Drug use: Yes    Types: Marijuana    Comment: about a month ago   Allergies   Patient has no known allergies.  Review of Systems Review of Systems  Skin:  Positive for rash.   Pertinent findings revealed after performing a 14 point review of systems has been noted in the history of present illness.  Physical Exam Vital Signs BP 124/62 (BP Location: Left Arm)   Pulse 63   Temp 97.9 F (36.6 C) (Oral)   Resp 16   LMP 10/13/2022 (Approximate)   SpO2 98%   No data found.  Physical Exam Vitals and nursing note reviewed.  Constitutional:      General: She is not in acute distress.    Appearance:  Normal appearance.  HENT:     Head: Normocephalic and atraumatic.  Eyes:     Pupils: Pupils are equal, round, and reactive to light.  Cardiovascular:     Rate and Rhythm: Normal rate and regular rhythm.  Pulmonary:     Effort: Pulmonary effort is normal.     Breath sounds: Normal breath sounds.  Musculoskeletal:        General: Normal range of motion.     Cervical back: Normal range of motion and neck supple.  Skin:    General: Skin is warm and dry.     Findings: Rash (TNTC papular lesions at various stages of healing without signs of superficial infection, some weeping clear fluid scattered across bilateral lower extremities.) present.  Neurological:     General: No focal deficit present.     Mental Status: She is alert and oriented to person, place, and time. Mental status is at baseline.  Psychiatric:        Mood and Affect: Mood normal.        Behavior: Behavior normal.        Thought Content: Thought content normal.        Judgment: Judgment normal.     Visual Acuity Right Eye Distance:   Left Eye Distance:   Bilateral Distance:    Right Eye Near:   Left Eye Near:    Bilateral Near:     UC Couse / Diagnostics / Procedures:     Radiology No results found.  Procedures Procedures (including critical care time) EKG  Pending results:  Labs Reviewed  POCT URINE PREGNANCY    Medications Ordered in UC: Medications - No data to display  UC Diagnoses / Final Clinical Impressions(s)   I have reviewed the triage vital signs and the nursing notes.  Pertinent labs & imaging results that were available during my care of the patient were reviewed by me and considered in my medical decision making (see chart for details).    Final diagnoses:  Rash and nonspecific skin eruption  Negative pregnancy test   Patient advised of negative pregnancy test.  Patient provided with a prescription for triamcinolone ointment to apply to lesions on lower extremities to help with  itching and inflammation.  Patient advised not to pick.  Patient advised she may need to follow-up with dermatologist if no better after 3 to 5 days.  Please  see discharge instructions below for details of plan of care as provided to patient. ED Prescriptions     Medication Sig Dispense Auth. Provider   triamcinolone ointment (KENALOG) 0.1 % Apply 1 Application topically 2 (two) times daily. Apply to affected area(s) twice daily , do not apply to face. 80 g Theadora Rama Scales, PA-C      PDMP not reviewed this encounter.  Pending results:  Labs Reviewed  POCT URINE PREGNANCY    Discharge Instructions:   Discharge Instructions      I provided you with a prescription for triamcinolone ointment that you can apply twice daily to all itchy areas on your lower legs.  This medication will relieve the itching and swelling.  Please try very hard not to scratch and pick.  If you do not see improvement of the rash after using triamcinolone for the next 3 to 5 days, please consider following up with a dermatologist for further evaluation.  Your pregnancy test today was negative.  Thank you for visiting [facility].        Disposition Upon Discharge:  Condition: stable for discharge home  Patient presented with an acute illness with associated systemic symptoms and significant discomfort requiring urgent management. In my opinion, this is a condition that a prudent lay person (someone who possesses an average knowledge of health and medicine) may potentially expect to result in complications if not addressed urgently such as respiratory distress, impairment of bodily function or dysfunction of bodily organs.   Routine symptom specific, illness specific and/or disease specific instructions were discussed with the patient and/or caregiver at length.   As such, the patient has been evaluated and assessed, work-up was performed and treatment was provided in alignment with  urgent care protocols and evidence based medicine.  Patient/parent/caregiver has been advised that the patient may require follow up for further testing and treatment if the symptoms continue in spite of treatment, as clinically indicated and appropriate.  Patient/parent/caregiver has been advised to return to the [facility] or PCP if no better; to PCP or the Emergency Department if new signs and symptoms develop, or if the current signs or symptoms continue to change or worsen for further workup, evaluation and treatment as clinically indicated and appropriate  The patient will follow up with their current PCP if and as advised. If the patient does not currently have a PCP we will assist them in obtaining one.   The patient may need specialty follow up if the symptoms continue, in spite of conservative treatment and management, for further workup, evaluation, consultation and treatment as clinically indicated and appropriate.  Patient/parent/caregiver verbalized understanding and agreement of plan as discussed.  All questions were addressed during visit.  Please see discharge instructions below for further details of plan.  This office note has been dictated using Teaching laboratory technician.  Unfortunately, this method of dictation can sometimes lead to typographical or grammatical errors.  I apologize for your inconvenience in advance if this occurs.  Please do not hesitate to reach out to me if clarification is needed.      Theadora Rama Scales, PA-C 11/05/22 1233

## 2022-11-05 NOTE — Discharge Instructions (Addendum)
I provided you with a prescription for triamcinolone ointment that you can apply twice daily to all itchy areas on your lower legs.  This medication will relieve the itching and swelling.  Please try very hard not to scratch and pick.  If you do not see improvement of the rash after using triamcinolone for the next 3 to 5 days, please consider following up with a dermatologist for further evaluation.  Your pregnancy test today was negative.  Thank you for visiting  Urgent Care.

## 2022-12-12 ENCOUNTER — Other Ambulatory Visit (HOSPITAL_COMMUNITY)
Admission: RE | Admit: 2022-12-12 | Discharge: 2022-12-12 | Disposition: A | Discharge status: Home / Self Care | Payer: Self-pay | Source: Ambulatory Visit | Attending: Physician Assistant | Admitting: Physician Assistant

## 2022-12-12 ENCOUNTER — Ambulatory Visit: Payer: Self-pay | Admitting: Physician Assistant

## 2022-12-12 ENCOUNTER — Encounter: Payer: Self-pay | Admitting: Physician Assistant

## 2022-12-12 VITALS — BP 115/72 | HR 60 | Temp 97.4°F | Ht 67.0 in | Wt 179.0 lb

## 2022-12-12 DIAGNOSIS — W57XXXA Bitten or stung by nonvenomous insect and other nonvenomous arthropods, initial encounter: Secondary | ICD-10-CM

## 2022-12-12 DIAGNOSIS — N760 Acute vaginitis: Secondary | ICD-10-CM

## 2022-12-12 DIAGNOSIS — B9689 Other specified bacterial agents as the cause of diseases classified elsewhere: Secondary | ICD-10-CM

## 2022-12-12 DIAGNOSIS — Z113 Encounter for screening for infections with a predominantly sexual mode of transmission: Secondary | ICD-10-CM | POA: Insufficient documentation

## 2022-12-12 DIAGNOSIS — E876 Hypokalemia: Secondary | ICD-10-CM

## 2022-12-12 MED ORDER — TRIAMCINOLONE ACETONIDE 0.1 % EX OINT: 1.0000 | TOPICAL_OINTMENT | Freq: Two times a day (BID) | CUTANEOUS | 1 refills | Status: DC

## 2022-12-12 NOTE — Patient Instructions (Signed)
I sent a refill of your steroid cream to your pharmacy.  I do encourage you to treat your animals and your home and yard for fleas.  We will call you with today's lab results.  Roney Jaffe, PA-C Physician Assistant [facility] [email or web address]   Insect Bite, Adult An insect bite can make your skin red, itchy, and swollen. An insect bite is different from an insect sting, which happens when an insect injects poison (venom) into the skin. Some insects can spread disease to people through a bite. However, most insect bites do not lead to disease and are not serious. What are the causes? Insects may bite for a variety of reasons, including: Hunger. To defend themselves. Insects that bite include: Spiders. Mosquitoes and flies. Ticks and fleas. Ants. Kissing bugs. Chiggers. What are the signs or symptoms? In many cases, symptoms last for 2-4 days. However, itching can last up to 10 days. Symptoms include: Itching or pain in the bite area. Redness and swelling in the bite area. An open wound (skin ulcer). In rare cases, a person may have a severe allergic reaction (anaphylactic reaction) to a bite. Symptoms of an anaphylactic reaction may include: Feeling warm in the face (flushed). This may include redness. Itchy, red, swollen areas of skin (hives). Swelling of the eyes, lips, face, mouth, tongue, or throat. Wheezing or difficulty breathing, speaking, or swallowing. Dizziness, light-headedness, or fainting. Abdominal symptoms like cramping, nausea, vomiting, or diarrhea. How is this diagnosed? This condition is usually diagnosed based on symptoms and a physical exam. During the exam, your health care provider will look at the bite and ask you what kind of insect bit you. How is this treated? Most insect bites are not serious. Symptoms often go away on their own and treatment is not usually needed. When treatment is  recommended, it may include: Applying ice to the affected area. Applying steroid or other anti-itch creams, like calamine lotion, to the bite area. Medicines called antihistamines to reduce itching. You may also need: A tetanus shot if you are not up to date. Antibiotic cream or an oral antibiotic if the bite becomes infected (this is uncommon). Follow these instructions at home: Bite area care  Do not scratch the bite area. It may help to cover the bite area with a bandage or close-fitting clothing. Keep the bite area clean and dry. Wash it every day with soap and water as told by your health care provider. Check the bite area every day for signs of infection. Check for: More redness, swelling, or pain. Fluid or blood. Warmth. Pus or a bad smell. Managing pain, itching, and swelling  You may apply cortisone cream, calamine lotion, or a paste made of baking soda and water to the bite area as told by your health care provider. If directed, put ice on the bite area. To do this: Put ice in a plastic bag. Place a towel between your skin and the bag. Leave the ice on for 20 minutes, 2-3 times a day. If your skin turns bright red, remove the ice right away to prevent skin damage. The risk of skin damage is higher if you cannot feel pain, heat, or cold. General instructions Apply or take over-the-counter and prescription medicine only as told by your health care provider. If you were prescribed antibiotics, take or apply them as told by your health care provider. Do not stop using the antibiotic even if you start to feel better. How is this prevented? To  help reduce your risk of insect bites: When you are outdoors, wear clothing that covers your arms and legs. This is especially important in the early morning and evening. Use insect repellent. The best insect repellents contain DEET, picaridin, oil of lemon eucalyptus (OLE), or IR3535. Consider spraying your clothing with a pesticide called  permethrin. Permethrin helps prevent insect bites. It works for several weeks and for up to 5-6 clothing washes. Do not apply permethrin directly to the skin. If your home windows do not have screens, consider installing them. If you will be sleeping in an area where there are mosquitoes, consider covering your sleeping area with a mosquito net. Contact a health care provider if: Your bite area has signs of infection, such as: More redness, swelling, or pain. Fluid or blood. Warmth. Pus or a bad smell. You have a fever. Get help right away if: You have a rash. You have muscle or joint pain. You feel unusually tired or weak. You have neck pain or a headache. You develop symptoms of an anaphylactic reaction. These may include: Swelling of the eyes, lips, face, mouth, tongue, or throat. Flushed skin or hives. Wheezing. Difficulty breathing, speaking, or swallowing. Dizziness, light-headedness, or fainting. Abdominal pain, cramping, vomiting, or diarrhea. These symptoms may be an emergency. Get help right away. Call 911. Do not wait to see if the symptoms will go away. Do not drive yourself to the hospital. Summary An insect bite can make your skin red, itchy, and swollen. Treatment is usually not needed. Symptoms often go away on their own. When treatment is recommended, it may involve taking medicine, applying medicine to the area, or applying ice. Apply or take over-the-counter and prescription medicines only as told by your health care provider. Use insect repellent to help prevent insect bites. Contact a health care provider if your bite area has signs of infection. This information is not intended to replace advice given to you by your health care provider. Make sure you discuss any questions you have with your health care provider. Document Revised: 08/16/2021 Document Reviewed: 08/01/2021 Elsevier Patient Education  2024 [employer].

## 2022-12-12 NOTE — Progress Notes (Signed)
New Patient Office Visit  Subjective    Patient ID: Ashley Morris, female    DOB: 11-15-1995  Age: 27 y.o. MRN: [id number]  CC:  Chief Complaint  Patient presents with   Rash    Noticed on Feet and legs    Std check     No symptoms     HPI Ashley Morris states that she was seen at urgent care on November 05, 2022 for a rash on both lower legs and feet.  Note from that visit: HPI Ashley Morris is a pleasant, 27 y.o. female who presents to urgent care today. Onset; Pt reports blister on legs and feet for several months.   Patient provided with a prescription for triamcinolone ointment to apply to lesions on lower extremities to help with itching and inflammation.  Patient advised not to pick.  Patient advised she may need to follow-up with dermatologist if no better after 3 to 5 days.  States today that she did try the steroid cream, has been using it twice a day without relief.  States that she continues to get little red blisters that she describes as itchy and then burning after she scratches them.  States that they continue to be on her lower legs ankles and feet as well as a few on her arms.  Denies any new detergents, lotions, fragrances, body washes.  Does endorse that she has several dogs inside of her house and several dogs outside.  States that she believes these may be flea bites.  Request screening for sexually transmitted diseases, no symptoms, no known exposure.       Outpatient Encounter Medications as of 12/12/2022  Medication Sig   [DISCONTINUED] triamcinolone ointment (KENALOG) 0.1 % Apply 1 Application topically 2 (two) times daily. Apply to affected area(s) twice daily , do not apply to face.   ferrous sulfate 325 (65 FE) MG tablet Take 1 tablet (325 mg total) by mouth every other day. (Patient not taking: Reported on 12/12/2022)   pantoprazole (PROTONIX) 40 MG tablet Take 1 tablet (40 mg total) by mouth daily. (Patient not taking: Reported on 12/12/2022)    Prenatal Vit-Fe Fumarate-FA (PREPLUS) 27-1 MG TABS Take 1 tablet by mouth daily. (Patient not taking: Reported on 12/12/2022)   triamcinolone ointment (KENALOG) 0.1 % Apply 1 Application topically 2 (two) times daily. Apply to affected area(s) twice daily , do not apply to face.   valACYclovir (VALTREX) 500 MG tablet Take 1 tablet (500 mg total) by mouth 2 (two) times daily. (Patient not taking: Reported on 09/22/2021)   No facility-administered encounter medications on file as of 12/12/2022.    Past Medical History:  Diagnosis Date   Abdominal pain in pregnancy, antepartum    Asthma    Decreased fetal movement affecting management of mother, antepartum 10/07/2014   Equivocal NST in clinic, had 10x10 accels but not reactive by criteria. Consulted Dr Debroah Loop who advises she go to [facility] for [facility]. Patient refuses due to transportation issues. States will come tomorrow.  Risks of fetal distress and still birth discussed.    Supervision of normal pregnancy in second trimester 06/17/2014    Clinic  HR Clinic Prenatal Labs  Dating  6 wk ultrasound Blood type: O/POS/-- (05/19 1516)   Genetic Screen  too late, no visits after 1st until 32w Antibody:NEG (05/19 1516)  Anatomic [place]  Normal at 20 wks, but limited face, spine > rescan in 4-6 wks > not done (currently 39 wks) Rubella: 1.64 (05/19 1516)  GTT Early:               Third trimester: 96 RPR: NON REAC (05/19 1516)   Flu vaccine  Dec   Urinary tract infection    UTI (urinary tract infection)     Past Surgical History:  Procedure Laterality Date   NO PAST SURGERIES      Family History  Problem Relation Age of Onset   Hypertension Mother    Anemia Mother     Social History   Socioeconomic History   Marital status: Single    Spouse name: Not on file   Number of children: Not on file   Years of education: Not on file   Highest education level: Not on file  Occupational History   Not on file  Tobacco Use   Smoking status: Some Days    Types:  Cigarettes   Smokeless tobacco: Never  Vaping Use   Vaping status: Never Used  Substance and Sexual Activity   Alcohol use: Not Currently   Drug use: Yes    Types: Marijuana    Comment: about a month ago   Sexual activity: Yes    Birth control/protection: None  Other Topics Concern   Not on file  Social History Narrative   Not on file   Social Determinants of Health   Financial Resource Strain: Not on file  Food Insecurity: Food Insecurity Present (09/14/2021)   Hunger Vital Sign    Worried About Running Out of Food in the Last Year: Sometimes true    Ran Out of Food in the Last Year: Sometimes true  Transportation Needs: Unmet Transportation Needs (09/14/2021)   PRAPARE - Administrator, Civil Service (Medical): Yes    Lack of Transportation (Non-Medical): Yes  Physical Activity: Not on file  Stress: Not on file  Social Connections: Not on file  Intimate Partner Violence: Not on file    Review of Systems  Constitutional:  Negative for chills and fever.  HENT: Negative.    Eyes: Negative.   Respiratory:  Negative for shortness of breath.   Cardiovascular:  Negative for chest pain.  Gastrointestinal: Negative.   Genitourinary: Negative.   Musculoskeletal: Negative.   Skin:  Positive for itching and rash.  Neurological: Negative.   Endo/Heme/Allergies: Negative.   Psychiatric/Behavioral: Negative.          Objective    BP 115/72 (BP Location: Left Arm, Patient Position: Sitting, Cuff Size: Large)   Pulse 60   Temp (!) 97.4 F (36.3 C) (Temporal)   Ht 5\' 7"  (1.702 m)   Wt 179 lb (81.2 kg)   SpO2 98%   BMI 28.04 kg/m   Physical Exam Vitals and nursing note reviewed.  HENT:     Head: Normocephalic and atraumatic.     Right Ear: External ear normal.     Left Ear: External ear normal.     Nose: Nose normal.     Mouth/Throat:     Mouth: Mucous membranes are moist.     Pharynx: Oropharynx is clear.  Eyes:     Extraocular Movements: Extraocular  movements intact.     Conjunctiva/sclera: Conjunctivae normal.     Pupils: Pupils are equal, round, and reactive to light.  Cardiovascular:     Rate and Rhythm: Normal rate and regular rhythm.     Pulses: Normal pulses.     Heart sounds: Normal heart sounds.  Pulmonary:     Effort: Pulmonary effort is normal.  Breath sounds: Normal breath sounds.  Musculoskeletal:     Cervical back: Normal range of motion and neck supple.  Skin:    Comments: TNTC papular lesions at various stages of healing on both lower legs, ankles and a few on feet, also a few scattered lesions on both forearms without signs of superficial infection  Neurological:     General: No focal deficit present.     Mental Status: She is alert and oriented to person, place, and time.  Psychiatric:        Mood and Affect: Mood normal.        Behavior: Behavior normal.        Thought Content: Thought content normal.        Judgment: Judgment normal.          Assessment & Plan:   Problem List Items Addressed This Visit   None Visit Diagnoses     Screen for STD (sexually transmitted disease)    -  Primary   Relevant Orders   HIV antibody (with reflex)   RPR   Cervicovaginal ancillary only   Flea bite of multiple sites       Relevant Medications   triamcinolone ointment (KENALOG) 0.1 %   Hypokalemia       Relevant Orders   Basic Metabolic Panel     1. Flea bite of multiple sites Continue current regimen.  Patient encouraged to have pets and home treated for fleas.  Patient education given on supportive care, red flags given for prompt reevaluation  Patient given appointment to establish care at [name]. - triamcinolone ointment (KENALOG) 0.1 %; Apply 1 Application topically 2 (two) times daily. Apply to affected area(s) twice daily , do not apply to face.  Dispense: 80 g; Refill: 1  2. Screen for STD (sexually transmitted disease)  - HIV antibody (with reflex) - RPR - Cervicovaginal  ancillary only  3. Hypokalemia  - Basic Metabolic Panel   I have reviewed the patient's medical history (PMH, PSH, Social History, Family History, Medications, and allergies) , and have been updated if relevant. I spent 30 minutes reviewing chart and  face to face time with patient.     Return in about 4 weeks (around 01/09/2023) for with Dr. Andrey Campanile at [facility], To establish PCP.   Kasandra Knudsen Mayers, PA-C

## 2022-12-13 LAB — BASIC METABOLIC PANEL
BUN/Creatinine Ratio: 19 (ref 9–23)
BUN: 13 mg/dL (ref 6–20)
CO2: 22 mmol/L (ref 20–29)
Calcium: 9.5 mg/dL (ref 8.7–10.2)
Chloride: 106 mmol/L (ref 96–106)
Creatinine, Ser: 0.7 mg/dL (ref 0.57–1.00)
Glucose: 87 mg/dL (ref 70–99)
Sodium: 144 mmol/L (ref 134–144)
eGFR: 121 mL/min/{1.73_m2} (ref >59)

## 2022-12-13 LAB — CERVICOVAGINAL ANCILLARY ONLY
Bacterial Vaginitis (gardnerella): POSITIVE — AB
Candida Glabrata: NEGATIVE
Chlamydia: NEGATIVE
Comment: NORMAL
Neisseria Gonorrhea: NEGATIVE
Trichomonas: NEGATIVE

## 2022-12-13 LAB — RPR: RPR Ser Ql: NONREACTIVE

## 2022-12-13 MED ORDER — METRONIDAZOLE 500 MG PO TABS: 500.0000 mg | ORAL_TABLET | Freq: Two times a day (BID) | ORAL | 0 refills | Status: AC

## 2022-12-13 NOTE — Addendum Note (Signed)
Addended by: Roney Jaffe on: 12/13/2022 12:04 PM   Modules accepted: Orders

## 2023-01-09 ENCOUNTER — Ambulatory Visit: Payer: Self-pay | Admitting: Family Medicine

## 2023-01-09 ENCOUNTER — Telehealth: Payer: Self-pay

## 2023-01-09 NOTE — Telephone Encounter (Signed)
Called patient to reschedule missed new patient appointment , sent letter in [place]

## 2023-01-15 ENCOUNTER — Institutional Professional Consult (permissible substitution): Payer: Medicaid Other | Admitting: Plastic Surgery

## 2023-01-30 ENCOUNTER — Ambulatory Visit: Payer: Self-pay | Admitting: Family Medicine

## 2023-03-05 ENCOUNTER — Ambulatory Visit (HOSPITAL_COMMUNITY): Payer: Self-pay

## 2023-04-10 ENCOUNTER — Ambulatory Visit: Payer: Medicaid Other | Admitting: Family Medicine

## 2023-06-20 ENCOUNTER — Other Ambulatory Visit (HOSPITAL_COMMUNITY)
Admission: RE | Admit: 2023-06-20 | Discharge: 2023-06-20 | Disposition: A | Discharge status: Home / Self Care | Payer: Medicaid Other | Source: Ambulatory Visit | Attending: Family Medicine | Admitting: Family Medicine

## 2023-06-20 ENCOUNTER — Ambulatory Visit (INDEPENDENT_AMBULATORY_CARE_PROVIDER_SITE_OTHER): Payer: Medicaid Other | Admitting: Family Medicine

## 2023-06-20 ENCOUNTER — Encounter: Payer: Self-pay | Admitting: Family Medicine

## 2023-06-20 VITALS — BP 122/77 | HR 78 | Temp 98.3°F | Resp 16 | Ht 67.0 in | Wt 184.0 lb

## 2023-06-20 DIAGNOSIS — Z113 Encounter for screening for infections with a predominantly sexual mode of transmission: Secondary | ICD-10-CM

## 2023-06-20 DIAGNOSIS — F411 Generalized anxiety disorder: Secondary | ICD-10-CM

## 2023-06-20 DIAGNOSIS — Z7689 Persons encountering health services in other specified circumstances: Secondary | ICD-10-CM | POA: Diagnosis not present

## 2023-06-20 MED ORDER — SERTRALINE HCL 25 MG PO TABS: 25.0000 mg | ORAL_TABLET | Freq: Every day | ORAL | 3 refills | Status: DC

## 2023-06-21 ENCOUNTER — Encounter: Payer: Self-pay | Admitting: Family Medicine

## 2023-06-21 NOTE — Progress Notes (Signed)
New Patient Office Visit  Subjective    Patient ID: Ashley Morris, female    DOB: December 24, 1995  Age: 28 y.o. MRN: [id number]  CC:  Chief Complaint  Patient presents with   Establish Care    Std testing, something growing on patient foot    HPI Ashley Morris presents to establish care and desiring STD screen. Patient is sexually active with female partner - she denies GU sx. Patient also reports increased social stressors and anxiety.    Outpatient Encounter Medications as of 06/20/2023  Medication Sig   sertraline (ZOLOFT) 25 MG tablet Take 1 tablet (25 mg total) by mouth daily.   ferrous sulfate 325 (65 FE) MG tablet Take 1 tablet (325 mg total) by mouth every other day. (Patient not taking: Reported on 12/12/2022)   pantoprazole (PROTONIX) 40 MG tablet Take 1 tablet (40 mg total) by mouth daily. (Patient not taking: Reported on 12/12/2022)   Prenatal Vit-Fe Fumarate-FA (PREPLUS) 27-1 MG TABS Take 1 tablet by mouth daily. (Patient not taking: Reported on 12/12/2022)   triamcinolone ointment (KENALOG) 0.1 % Apply 1 Application topically 2 (two) times daily. Apply to affected area(s) twice daily , do not apply to face. (Patient not taking: Reported on 06/20/2023)   valACYclovir (VALTREX) 500 MG tablet Take 1 tablet (500 mg total) by mouth 2 (two) times daily. (Patient not taking: Reported on 09/22/2021)   No facility-administered encounter medications on file as of 06/20/2023.    Past Medical History:  Diagnosis Date   Abdominal pain in pregnancy, antepartum    Asthma    Decreased fetal movement affecting management of mother, antepartum 10/07/2014   Equivocal NST in clinic, had 10x10 accels but not reactive by criteria. Consulted Dr Debroah Loop who advises she go to [facility] for [facility]PP. Patient refuses due to transportation issues. States will come tomorrow.  Risks of fetal distress and still birth discussed.    Supervision of normal pregnancy in second trimester 06/17/2014    Clinic  HR Clinic Prenatal  Labs  Dating  6 wk ultrasound Blood type: O/POS/-- (05/19 1516)   Genetic Screen  too late, no visits after 1st until 32w Antibody:NEG (05/19 1516)  Anatomic [place]  Normal at 20 wks, but limited face, spine > rescan in 4-6 wks > not done (currently 39 wks) Rubella: 1.64 (05/19 1516)  GTT Early:               Third trimester: 96 RPR: NON REAC (05/19 1516)   Flu vaccine  Dec   Urinary tract infection    UTI (urinary tract infection)     Past Surgical History:  Procedure Laterality Date   NO PAST SURGERIES      Family History  Problem Relation Age of Onset   Hypertension Mother    Anemia Mother     Social History   Socioeconomic History   Marital status: Single    Spouse name: Not on file   Number of children: Not on file   Years of education: Not on file   Highest education level: Not on file  Occupational History   Not on file  Tobacco Use   Smoking status: Some Days    Types: Cigarettes   Smokeless tobacco: Never  Vaping Use   Vaping status: Never Used  Substance and Sexual Activity   Alcohol use: Not Currently   Drug use: Yes    Types: Marijuana    Comment: about a month ago   Sexual activity: Yes  Birth control/protection: None  Other Topics Concern   Not on file  Social History Narrative   Not on file   Social Drivers of Health   Financial Resource Strain: Low Risk  (06/20/2023)   Overall Financial Resource Strain (CARDIA)    Difficulty of Paying Living Expenses: Not hard at all  Food Insecurity: No Food Insecurity (06/20/2023)   Hunger Vital Sign    Worried About Running Out of Food in the Last Year: Never true    Ran Out of Food in the Last Year: Never true  Transportation Needs: No Transportation Needs (06/20/2023)   PRAPARE - Administrator, Civil Service (Medical): No    Lack of Transportation (Non-Medical): No  Physical Activity: Insufficiently Active (06/20/2023)   Exercise Vital Sign    Days of Exercise per Week: 2 days    Minutes of  Exercise per Session: 30 min  Stress: No Stress Concern Present (06/20/2023)   [employer] of Occupational Health - Occupational Stress Questionnaire    Feeling of Stress : Not at all  Social Connections: Moderately Isolated (06/20/2023)   Social Connection and Isolation Panel [NHANES]    Frequency of Communication with Friends and Family: More than three times a week    Frequency of Social Gatherings with Friends and Family: More than three times a week    Attends Religious Services: More than 4 times per year    Active Member of [employer] or Organizations: No    Attends Banker Meetings: Never    Marital Status: Never married  Intimate Partner Violence: Not At Risk (06/20/2023)   Humiliation, Afraid, Rape, and Kick questionnaire    Fear of Current or Ex-Partner: No    Emotionally Abused: No    Physically Abused: No    Sexually Abused: No    Review of Systems  Psychiatric/Behavioral:  The patient is nervous/anxious.   All other systems reviewed and are negative.       Objective   BP 122/77 (BP Location: Left Arm, Patient Position: Sitting, Cuff Size: Normal)   Pulse 78   Temp 98.3 F (36.8 C) (Oral)   Resp 16   Ht 5\' 7"  (1.702 m)   Wt 184 lb (83.5 kg)   SpO2 98%   BMI 28.82 kg/m   Physical Exam Vitals and nursing note reviewed.  Constitutional:      General: She is not in acute distress. Cardiovascular:     Rate and Rhythm: Normal rate and regular rhythm.  Pulmonary:     Effort: Pulmonary effort is normal.     Breath sounds: Normal breath sounds.  Abdominal:     Palpations: Abdomen is soft.     Tenderness: There is no abdominal tenderness.  Neurological:     General: No focal deficit present.     Mental Status: She is alert and oriented to person, place, and time.  Psychiatric:        Mood and Affect: Mood is anxious.         Assessment & Plan:  1. Screen for STD (sexually transmitted disease) (Primary)  - Cervicovaginal ancillary  only  2. Anxiety state Zoloft 25 mg daily prescribed.   3. Encounter to establish care     Return in about 4 weeks (around 07/18/2023) for follow up.   Tommie Raymond, MD

## 2023-06-27 ENCOUNTER — Telehealth: Payer: Self-pay

## 2023-06-27 ENCOUNTER — Other Ambulatory Visit: Payer: Self-pay | Admitting: Family Medicine

## 2023-06-27 ENCOUNTER — Encounter: Payer: Self-pay | Admitting: Family Medicine

## 2023-06-27 ENCOUNTER — Encounter: Payer: Self-pay | Admitting: *Deleted

## 2023-06-27 ENCOUNTER — Other Ambulatory Visit: Payer: Self-pay

## 2023-06-27 LAB — CERVICOVAGINAL ANCILLARY ONLY
Bacterial Vaginitis (gardnerella): POSITIVE — AB
Candida Glabrata: NEGATIVE
Candida Vaginitis: POSITIVE — AB
Chlamydia: NEGATIVE
Comment: NEGATIVE
Comment: NEGATIVE
Comment: NEGATIVE
Comment: NEGATIVE
Comment: NEGATIVE
Comment: NORMAL
Neisseria Gonorrhea: NEGATIVE
Trichomonas: POSITIVE — AB

## 2023-06-27 MED ORDER — METRONIDAZOLE 500 MG PO TABS: 500.0000 mg | ORAL_TABLET | Freq: Two times a day (BID) | ORAL | 0 refills | Status: AC

## 2023-06-27 MED ORDER — FLUCONAZOLE 150 MG PO TABS: 150.0000 mg | ORAL_TABLET | Freq: Once | ORAL | 0 refills | Status: AC

## 2023-06-27 NOTE — Telephone Encounter (Signed)
 Please advise!!!  Patient  needing antibiotics due to her lab results.

## 2023-07-18 ENCOUNTER — Ambulatory Visit: Payer: Medicaid Other | Admitting: Family Medicine

## 2023-07-18 ENCOUNTER — Ambulatory Visit: Payer: Self-pay | Admitting: Family Medicine

## 2023-07-18 NOTE — Telephone Encounter (Signed)
 Copied from CRM [id number]. Topic: Clinical - Medical Advice >> Jul 18, 2023  2:31 PM Ashley Morris wrote: Reason for CRM: patient called stated she tested positive for a STD back at the end of Jan but after taking the medication the symptoms never got better. Patient had an appt today but was not able to get off work to come. Patient is requesting another medication for the foul odor and other STD symptoms. Please f/u with patient  Additional Notes: This Triage RN Attempted to call the patient at this time. No answer, unable to leave voicemail.

## 2023-07-18 NOTE — Telephone Encounter (Addendum)
 This RN made third and final attempt to triage. No answer, unable to leave a message. Will route to office for follow-up.   This RN made second attempt to triage. No answer, unable to leave a message. Will route for continued attempts.

## 2023-07-18 NOTE — Telephone Encounter (Signed)
 I have attempted without success to contact this patient by phone to return their call.   I have attempted to contact this patient by phone with the following results: no answer, busy signal.

## 2023-07-29 ENCOUNTER — Ambulatory Visit
Admission: EM | Admit: 2023-07-29 | Discharge: 2023-07-29 | Disposition: A | Discharge status: Home / Self Care | Attending: Emergency Medicine | Admitting: Emergency Medicine

## 2023-07-29 ENCOUNTER — Encounter: Payer: Self-pay | Admitting: Emergency Medicine

## 2023-07-29 ENCOUNTER — Other Ambulatory Visit: Payer: Self-pay

## 2023-07-29 DIAGNOSIS — Z113 Encounter for screening for infections with a predominantly sexual mode of transmission: Secondary | ICD-10-CM | POA: Diagnosis present

## 2023-07-29 DIAGNOSIS — N898 Other specified noninflammatory disorders of vagina: Secondary | ICD-10-CM | POA: Diagnosis present

## 2023-07-29 NOTE — ED Triage Notes (Signed)
 Pt here requesting STD testing; pt sts some odor and discharge; pt sts hx of recent treatment for BV and trichomonas

## 2023-07-29 NOTE — Discharge Instructions (Signed)
 Check my chart for results. Avoid sexual activity until results,treatment known and completed. Safe sex with all future sexual activity. We have sent testing for sexually transmitted infections. We will notify you of any positive results once they are received. If required, we will prescribe any medications you might need.   Please refrain from all sexual activity   Follow up with PCP. Return as needed.

## 2023-07-29 NOTE — ED Provider Notes (Signed)
 [facility]    CSN: [id number] Arrival date & time: 07/29/23  0949      History   Chief Complaint Chief Complaint  Patient presents with   Exposure to STD    HPI Ashley Morris is a 28 y.o. female.   Ashley Morris, 28 year old female, presents to urgent care for evaluation of vaginal odor and discharge. Pt states she was recently treated for BV and trichomonas, but still has odor. Pt denies new partner or new exposure, "just wants to get checked"  Pt is on Nexplanon for [facility]  The history is provided by the patient. No language interpreter was used.    Past Medical History:  Diagnosis Date   Abdominal pain in pregnancy, antepartum    Asthma    Decreased fetal movement affecting management of mother, antepartum 10/07/2014   Equivocal NST in clinic, had 10x10 accels but not reactive by criteria. Consulted Dr Debroah Loop who advises she go to [facility] for [facility]. Patient refuses due to transportation issues. States will come tomorrow.  Risks of fetal distress and still birth discussed.    Supervision of normal pregnancy in second trimester 06/17/2014    Clinic  HR Clinic Prenatal Labs  Dating  6 wk ultrasound Blood type: O/POS/-- (05/19 1516)   Genetic Screen  too late, no visits after 1st until 32w Antibody:NEG (05/19 1516)  Anatomic [place]  Normal at 20 wks, but limited face, spine > rescan in 4-6 wks > not done (currently 39 wks) Rubella: 1.64 (05/19 1516)  GTT Early:               Third trimester: 96 RPR: NON REAC (05/19 1516)   Flu vaccine  Dec   Urinary tract infection    UTI (urinary tract infection)     Patient Active Problem List   Diagnosis Date Noted   Vaginal discharge 07/29/2023   Normal labor 09/28/2021   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   Abnormal GTT (glucose tolerance test) 08/04/2021   Susceptible to varicella (non-immune), currently pregnant 06/09/2021   Supervision of high risk pregnancy, antepartum 06/08/2021   Limited prenatal care 06/08/2021   Routine screening for  STI (sexually transmitted infection)    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 Authorizing Provider  ferrous sulfate 325 (65 FE) MG tablet Take 1 tablet (325 mg total) by mouth every other day. Patient not taking: Reported on 12/12/2022 10/01/21   Raelyn Mora, CNM  fluconazole (DIFLUCAN) 150 MG tablet Take 150 mg by mouth once. Patient not taking: Reported on 07/29/2023 06/27/23   [provider]  pantoprazole (PROTONIX) 40 MG tablet Take 1 tablet (40 mg total) by mouth daily. Patient not taking: Reported on 12/12/2022 07/21/22   Prosperi, Christian H, PA-C  Prenatal Vit-Fe Fumarate-FA (PREPLUS) 27-1 MG TABS Take 1 tablet by mouth daily. Patient not taking: Reported on 12/12/2022 04/30/21   Reita Cliche,  Samantha C, CNM  sertraline (ZOLOFT) 25 MG tablet Take 1 tablet (25 mg total) by mouth daily. 06/20/23   Georganna Skeans, MD  triamcinolone ointment (KENALOG) 0.1 % Apply 1 Application topically 2 (two) times daily. Apply to affected area(s) twice daily , do not apply to face. Patient not taking: Reported on 06/20/2023 12/12/22   Mayers, Cari S, PA-C  valACYclovir (VALTREX) 500 MG tablet Take 1 tablet (500 mg total) by mouth 2 (two) times daily. Patient not taking: Reported on 09/22/2021 09/14/21   Marny Lowenstein, PA-C    Family History Family History  Problem Relation Age of Onset   Hypertension Mother    Anemia Mother     Social History Social History   Tobacco Use   Smoking status: Some Days    Types: Cigarettes   Smokeless tobacco: Never  Vaping Use   Vaping status: Never Used  Substance Use Topics   Alcohol use: Not Currently    Drug use: Yes    Types: Marijuana    Comment: about a month ago     Allergies   Patient has no known allergies.   Review of Systems Review of Systems  Constitutional:  Negative for fever.  Gastrointestinal:  Negative for abdominal pain, nausea and vomiting.  Genitourinary:  Positive for vaginal discharge.       Vaginal odor  All other systems reviewed and are negative.    Physical Exam Triage Vital Signs ED Triage Vitals  Encounter Vitals Group     BP      Systolic BP Percentile      Diastolic BP Percentile      Pulse      Resp      Temp      Temp src      SpO2      Weight      Height      Head Circumference      Peak Flow      Pain Score      Pain Loc      Pain Education      Exclude from Growth Chart    No data found.  Updated Vital Signs BP 132/66 (BP Location: Left Arm)   Pulse 68   Temp 97.7 F (36.5 C) (Oral)   Resp 18   SpO2 97%   Visual Acuity Right Eye Distance:   Left Eye Distance:   Bilateral Distance:    Right Eye Near:   Left Eye Near:    Bilateral Near:     Physical Exam Vitals and nursing note reviewed.  Constitutional:      General: She is not in acute distress.    Appearance: She is well-developed and well-groomed.  HENT:     Head: Normocephalic.  Eyes:     Conjunctiva/sclera: Conjunctivae normal.  Cardiovascular:     Rate and Rhythm: Normal rate.     Heart sounds: No murmur heard. Pulmonary:     Effort: Pulmonary effort is normal. No respiratory distress.  Abdominal:     Palpations: Abdomen is soft.     Tenderness: There is no abdominal tenderness.  Genitourinary:    Comments: Deferred pt self swabbed Musculoskeletal:        General: No swelling.     Cervical back: Neck supple.  Skin:    General: Skin is warm and dry.     Capillary Refill: Capillary refill takes less than 2 seconds.  Neurological:     General: No focal deficit present.  Mental Status: She is alert and oriented to person, place, and time.      GCS: GCS eye subscore is 4. GCS verbal subscore is 5. GCS motor subscore is 6.     Cranial Nerves: No cranial nerve deficit.     Sensory: No sensory deficit.  Psychiatric:        Attention and Perception: Attention normal.        Mood and Affect: Mood normal.        Speech: Speech normal.        Behavior: Behavior normal. Behavior is cooperative.      UC Treatments / Results  Labs (all labs ordered are listed, but only abnormal results are displayed) Labs Reviewed  HIV ANTIBODY (ROUTINE TESTING W REFLEX)  RPR  CERVICOVAGINAL ANCILLARY ONLY    EKG   Radiology No results found.  Procedures Procedures (including critical care time)  Medications Ordered in UC Medications - No data to display  Initial Impression / Assessment and Plan / UC Course  I have reviewed the triage vital signs and the nursing notes.  Pertinent labs & imaging results that were available during my care of the patient were reviewed by me and considered in my medical decision making (see chart for details).    Discussed exam findings and plan of care with patient, strict go to ER precautions given.   Check my chart for results, will treat accordingly after results known, safe sex discussed. Patient verbalized understanding to this provider.  Ddx: STI testing, vaginal discharge,vaginal odor, BV Final Clinical Impressions(s) / UC Diagnoses   Final diagnoses:  Routine screening for STI (sexually transmitted infection)  Vaginal odor  Vaginal discharge     Discharge Instructions      Check my chart for results. Avoid sexual activity until results,treatment known and completed. Safe sex with all future sexual activity. We have sent testing for sexually transmitted infections. We will notify you of any positive results once they are received. If required, we will prescribe any medications you might need.   Please refrain from all sexual activity   Follow up with PCP. Return as needed.     ED  Prescriptions   None    PDMP not reviewed this encounter.   Clancy Gourd, NP 07/29/23 1039

## 2023-07-30 ENCOUNTER — Telehealth (HOSPITAL_COMMUNITY): Payer: Self-pay

## 2023-07-30 LAB — CERVICOVAGINAL ANCILLARY ONLY
Bacterial Vaginitis (gardnerella): POSITIVE — AB
Candida Glabrata: NEGATIVE
Candida Vaginitis: NEGATIVE
Chlamydia: NEGATIVE
Comment: NEGATIVE
Comment: NEGATIVE
Comment: NEGATIVE
Comment: NEGATIVE
Comment: NEGATIVE
Comment: NORMAL
Neisseria Gonorrhea: NEGATIVE
Trichomonas: NEGATIVE

## 2023-07-30 LAB — RPR: RPR Ser Ql: NONREACTIVE

## 2023-07-30 LAB — HIV ANTIBODY (ROUTINE TESTING W REFLEX): HIV Screen 4th Generation wRfx: NONREACTIVE

## 2023-07-30 MED ORDER — METRONIDAZOLE 500 MG PO TABS: 500.0000 mg | ORAL_TABLET | Freq: Two times a day (BID) | ORAL | 0 refills | Status: AC

## 2023-07-30 NOTE — Telephone Encounter (Signed)
 Per protocol, pt requires tx with metronidazole. Rx sent to pharmacy on file.

## 2023-11-04 ENCOUNTER — Telehealth

## 2023-12-10 ENCOUNTER — Ambulatory Visit: Admitting: Family Medicine

## 2023-12-24 ENCOUNTER — Encounter: Admitting: Obstetrics and Gynecology

## 2023-12-25 ENCOUNTER — Ambulatory Visit

## 2023-12-28 ENCOUNTER — Ambulatory Visit

## 2024-01-11 ENCOUNTER — Ambulatory Visit
Admission: EM | Admit: 2024-01-11 | Discharge: 2024-01-11 | Disposition: A | Discharge status: Home / Self Care | Attending: Emergency Medicine | Admitting: Emergency Medicine

## 2024-01-11 ENCOUNTER — Encounter: Payer: Self-pay | Admitting: Emergency Medicine

## 2024-01-11 DIAGNOSIS — Z113 Encounter for screening for infections with a predominantly sexual mode of transmission: Secondary | ICD-10-CM | POA: Diagnosis present

## 2024-01-11 DIAGNOSIS — B349 Viral infection, unspecified: Secondary | ICD-10-CM | POA: Diagnosis present

## 2024-01-11 NOTE — ED Provider Notes (Signed)
 [facility]    CSN: [id number] Arrival date & time: 01/11/24  1337      History   Chief Complaint Chief Complaint  Patient presents with   Vaginal Discharge   Nasal Congestion    HPI Ashley Morris is a 27 y.o. female.  This morning felt weak and hot. Some headache and slight nasal congestion. No cough. 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 Not itching or odorous New partner, unprotected intercourse  Would like STD testing On menstrual cycle now  Also reports bumps in the groin area. Not painful. Started after using nair, and waxing  Past Medical History:  Diagnosis Date   Abdominal pain in pregnancy, antepartum    Asthma    Decreased fetal movement affecting management of mother, antepartum 10/07/2014   Equivocal NST in clinic, had 10x10 accels but not reactive by criteria. Consulted Dr Eveline who advises she go to [facility] for [facility]PP. Patient refuses due to transportation issues. States will come tomorrow.  Risks of fetal distress and still birth discussed.    Supervision of normal pregnancy in second trimester 06/17/2014    Clinic  HR Clinic Prenatal Labs  Dating  6 wk ultrasound Blood type: O/POS/-- (05/19 1516)   Genetic Screen  too late, no visits after 1st until 32w Antibody:NEG (05/19 1516)  Anatomic US   Normal at 20 wks, but limited face, spine > rescan in 4-6 wks > not done (currently 39 wks) Rubella: 1.64 (05/19 1516)  GTT Early:               Third trimester: 96 RPR: NON REAC (05/19 1516)   Flu vaccine  Dec   Urinary tract infection    UTI (urinary tract infection)     Patient Active Problem List   Diagnosis Date Noted   Vaginal discharge 07/29/2023   Normal labor 09/28/2021   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   Abnormal GTT (glucose tolerance test) 08/04/2021   Susceptible to  varicella (non-immune), currently pregnant 06/09/2021   Supervision of high risk pregnancy, antepartum 06/08/2021   Limited prenatal care 06/08/2021   Routine screening for STI (sexually transmitted infection)    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 Authorizing Provider  ferrous sulfate  325 (65 FE) MG tablet Take 1 tablet (325 mg total) by mouth every other day. Patient not taking: Reported on 12/12/2022 10/01/21   Dawson, Rolitta, CNM  fluconazole  (DIFLUCAN ) 150 MG tablet Take 150 mg by mouth once. Patient not taking: Reported on 07/29/2023 06/27/23   [provider]  pantoprazole  (PROTONIX ) 40 MG tablet Take 1 tablet (40 mg total) by mouth daily. Patient not taking: Reported on 12/12/2022 07/21/22   Rosan, Sherlean  H, PA-C  Prenatal Vit-Fe Fumarate-FA (PREPLUS) 27-1 MG TABS Take 1 tablet by mouth daily. Patient not taking: Reported on 12/12/2022 04/30/21   Gene Lucie BROCKS, CNM  sertraline  (ZOLOFT ) 25 MG tablet Take 1 tablet (25 mg total) by mouth daily. Patient not taking: Reported on 01/11/2024 06/20/23   Tanda Bleacher, MD  triamcinolone  ointment (KENALOG ) 0.1 % Apply 1 Application topically 2 (two) times daily. Apply to affected area(s) twice daily , do not apply to face. Patient not taking: Reported on 06/20/2023 12/12/22   Mayers, Cari S, PA-C  valACYclovir  (VALTREX ) 500 MG tablet Take 1 tablet (500 mg total) by mouth 2 (two) times daily. Patient not taking: Reported on 09/22/2021 09/14/21   Larwence Mliss SAILOR, PA-C    Family History Family History  Problem Relation Age of Onset   Hypertension Mother    Anemia Mother     Social  History Social History   Tobacco Use   Smoking status: Some Days    Types: Cigarettes   Smokeless tobacco: Never  Vaping Use   Vaping status: Never Used  Substance Use Topics   Alcohol use: Not Currently   Drug use: Yes    Types: Marijuana    Comment: about a month ago     Allergies   Patient has no known allergies.   Review of Systems Review of Systems  Genitourinary:  Positive for vaginal discharge.   As per HPI  Physical Exam Triage Vital Signs ED Triage Vitals  Encounter Vitals Group     BP 01/11/24 1426 122/75     Girls Systolic BP Percentile --      Girls Diastolic BP Percentile --      Boys Systolic BP Percentile --      Boys Diastolic BP Percentile --      Pulse Rate 01/11/24 1426 94     Resp 01/11/24 1426 (!) 24     Temp 01/11/24 1426 99.3 F (37.4 C)     Temp Source 01/11/24 1426 Oral     SpO2 01/11/24 1426 96 %     Weight --      Height --      Head Circumference --      Peak Flow --      Pain Score 01/11/24 1427 0     Pain Loc --      Pain Education --      Exclude from Growth Chart --    No data found.  Updated Vital Signs BP 122/75 (BP Location: Left Arm)   Pulse 94   Temp 99.3 F (37.4 C) (Oral)   Resp (!) 24   LMP 01/11/2024   SpO2 96%   Physical Exam Vitals and nursing note reviewed. Exam conducted with a chaperone present Rick Fields RN).  Constitutional:      General: She is not in acute distress.    Appearance: Normal appearance.  HENT:     Right Ear: Tympanic membrane and ear canal normal.     Left Ear: Tympanic membrane and ear canal normal.     Nose: No congestion or rhinorrhea.     Mouth/Throat:     Pharynx: Oropharynx is clear. No oropharyngeal exudate or posterior oropharyngeal erythema.  Eyes:     Conjunctiva/sclera: Conjunctivae normal.  Cardiovascular:     Rate and Rhythm: Normal rate and regular rhythm.     Pulses: Normal pulses.     Heart sounds: Normal heart sounds.  Pulmonary:     Effort: Pulmonary effort  is normal.     Breath sounds: Normal breath sounds.  Abdominal:     Palpations: Abdomen is soft.     Tenderness: There is no abdominal tenderness. There is no guarding.  Genitourinary:    Comments: Vaginal bleeding. Swab obtained during exam. Mons pubis area has 1-2 minuscule non-tender bumps at base of hair follicles. Not erythematous or ulcerative. No groin LAD Musculoskeletal:     Cervical back: Normal range of motion.  Lymphadenopathy:     Cervical: No cervical adenopathy.  Skin:    General: Skin is warm and dry.  Neurological:     Mental Status: She is alert and oriented to person, place, and time.     UC Treatments / Results  Labs (all labs ordered are listed, but only abnormal results are displayed) Labs Reviewed  CERVICOVAGINAL ANCILLARY ONLY    EKG   Radiology No results found.  Procedures Procedures   Medications Ordered in UC Medications - No data to display  Initial Impression / Assessment and Plan / UC Course  I have reviewed the triage vital signs and the nursing notes.  Pertinent labs & imaging results that were available during my care of the patient were reviewed by me and considered in my medical decision making (see chart for details).  STD testing Cytology swab pending  Groin bumps Likely irritation from recent nair and waxing No infection Recommend compress, avoid further hair removal   Viral URI Temp 99.3 in clinic Declines tylenol  dose. Declines covid test.  Advised supportive care, OTC options, likely prognosis.  Note for work is provided  Final Clinical Impressions(s) / UC Diagnoses   Final diagnoses:  Screen for STD (sexually transmitted disease)  Viral illness     Discharge Instructions      I recommend taking tylenol  -- two of the 500 mg every 6-8 hours for fever and aches Drink LOTS of water  If you have a fever tonight, you cannot go to work tomorrow  We will call you if anything on your swab returns positive. You  can also see these results on MyChart. Please abstain from sexual intercourse until your results return.  For the groin bumps, you can apply warm compress. Avoid nair, shaving, and waxing for 3-4 weeks to prevent worsening.      ED Prescriptions   None    PDMP not reviewed this encounter.   Jeryl Stabs, PA-C 01/11/24 1544

## 2024-01-11 NOTE — Discharge Instructions (Addendum)
 I recommend taking tylenol  -- two of the 500 mg every 6-8 hours for fever and aches Drink LOTS of water  If you have a fever tonight, you cannot go to work tomorrow  We will call you if anything on your swab returns positive. You can also see these results on MyChart. Please abstain from sexual intercourse until your results return.  For the groin bumps, you can apply warm compress. Avoid nair, shaving, and waxing for 3-4 weeks to prevent worsening.

## 2024-01-11 NOTE — ED Triage Notes (Signed)
 Pt c/o vaginal discharge x's 1 week (white)  with rash   Also c/o nasal congestion, productive cough (yellow) and feeling dizzy

## 2024-01-13 ENCOUNTER — Ambulatory Visit (HOSPITAL_COMMUNITY): Payer: Self-pay

## 2024-01-13 LAB — CERVICOVAGINAL ANCILLARY ONLY
Bacterial Vaginitis (gardnerella): POSITIVE — AB
Candida Glabrata: NEGATIVE
Candida Vaginitis: POSITIVE — AB
Chlamydia: NEGATIVE
Comment: NEGATIVE
Comment: NEGATIVE
Comment: NEGATIVE
Comment: NEGATIVE
Comment: NEGATIVE
Comment: NORMAL
Neisseria Gonorrhea: NEGATIVE
Trichomonas: NEGATIVE

## 2024-01-13 MED ORDER — FLUCONAZOLE 150 MG PO TABS: 150.0000 mg | ORAL_TABLET | Freq: Every day | ORAL | 0 refills | Status: DC

## 2024-01-13 MED ORDER — METRONIDAZOLE 500 MG PO TABS: 500.0000 mg | ORAL_TABLET | Freq: Two times a day (BID) | ORAL | 0 refills | Status: DC

## 2024-01-30 ENCOUNTER — Ambulatory Visit: Admission: EM | Admit: 2024-01-30 | Discharge: 2024-01-30 | Disposition: A | Discharge status: Home / Self Care

## 2024-01-30 ENCOUNTER — Encounter: Payer: Self-pay | Admitting: Emergency Medicine

## 2024-01-30 DIAGNOSIS — Z113 Encounter for screening for infections with a predominantly sexual mode of transmission: Secondary | ICD-10-CM | POA: Diagnosis not present

## 2024-01-30 DIAGNOSIS — N9089 Other specified noninflammatory disorders of vulva and perineum: Secondary | ICD-10-CM

## 2024-01-30 DIAGNOSIS — J069 Acute upper respiratory infection, unspecified: Secondary | ICD-10-CM

## 2024-01-30 DIAGNOSIS — F17210 Nicotine dependence, cigarettes, uncomplicated: Secondary | ICD-10-CM | POA: Insufficient documentation

## 2024-01-30 DIAGNOSIS — Z20822 Contact with and (suspected) exposure to covid-19: Secondary | ICD-10-CM

## 2024-01-30 LAB — POC SOFIA SARS ANTIGEN FIA: SARS Coronavirus 2 Ag: NEGATIVE

## 2024-01-30 LAB — POCT URINE PREGNANCY: Preg Test, Ur: NEGATIVE

## 2024-01-30 MED ORDER — IPRATROPIUM-ALBUTEROL 0.5-2.5 (3) MG/3ML IN SOLN
3.0000 mL | Freq: Once | RESPIRATORY_TRACT | Status: AC
  Administered 2024-01-30: 3 mL via RESPIRATORY_TRACT

## 2024-01-30 MED ORDER — BENZONATATE 200 MG PO CAPS: 200.0000 mg | ORAL_CAPSULE | Freq: Three times a day (TID) | ORAL | 0 refills | Status: AC | PRN

## 2024-01-30 MED ORDER — ALBUTEROL SULFATE HFA 108 (90 BASE) MCG/ACT IN AERS: 2.0000 | INHALATION_SPRAY | RESPIRATORY_TRACT | 0 refills | Status: AC | PRN

## 2024-01-30 MED ORDER — MUCINEX DM MAXIMUM STRENGTH 60-1200 MG PO TB12: 1.0000 | ORAL_TABLET | Freq: Two times a day (BID) | ORAL | 0 refills | Status: AC

## 2024-01-30 NOTE — Discharge Instructions (Addendum)
 Your symptoms are most likely caused by a respiratory infection, which affects areas like your nose, throat, or lungs. This type of infection is usually caused by a virus. Since your illness is caused by a virus, antibiotics won't help because they only treat infections caused by bacteria. Take the medications that were prescribed to you as directed. If you have a fever, headache, or body aches, you can also take Tylenol  or ibuprofen  to help you feel more comfortable. Be sure to drink plenty of fluids to stay hydrated--aim for enough to keep your urine a pale yellow color. This will also help to thin mucus and make it easier to clear from your body.   Using a cool mist humidifier at home to keep humidity levels above 50% can be helpful. You can also inhale steam for 10 to 15 minutes, 3 to 4 times a day. This can be done by sitting in the bathroom with a hot shower running, or by using over-the-counter vapor shower tablets to help with nasal congestion. Try to avoid cool or dry air as much as possible. When you sleep, keep your head elevated to help reduce post-nasal drainage. Be sure to get enough rest every night to support your recovery.Don't forget to replace your toothbrush once you start feeling better.   It's normal for a cough to linger for several weeks after a respiratory illness, even after other symptoms have resolved. This happens because the airways remain irritated and take time to fully heal. As long as the cough gradually improves and there are no new concerning symptoms, this is part of the normal recovery process.  You also requesting STI screen. Testing for gonorrhea, chlamydia, trichomonas, HIV and syphilis is pending. You should not have any sexual activity until you receive the results of the tests. You will only be notified for positive results. You may go online to MyChart and review your results. Practice safe sex practices by wearing a condom every time you have sex. Remember that  people who have STIs may not experience any symptoms. However, even without symptoms, these infections can be spread from person to person and require treatment. STIs can be treated, and many STIs can be cured. However, some STIs cannot be cured and will affect you for the rest of your life. It's important to be checked regularly for STIs. You should also consider taking pre-exposure prophylaxis (PrEP) to prevent HIV infection.  A small lesion was noted on the vulva today. It does not appear to be an abscess or active infection. The most likely cause is a hair bump, though a herpes (HSV) infection is less likely but still possible. A swab has been collected to test for HSV, and you will be contacted with the results if positive. In the meantime, you may apply moist, warm compresses to the area several times a day to help with comfort and healing. Do not mash, squeeze, or attempt to pop the lesion. Please avoid shaving or using hair removal creams in the area until the lesion has fully healed, as this may worsen irritation or introduce infection. Monitor the area closely. If you notice increased redness, swelling, drainage of pus, spreading pain, fever, or if you develop new lesions, please seek medical attention promptly. Otherwise, follow up as directed to review your test results and discuss next steps.  If your symptoms get worse or if you develop any new or concerning symptoms, go to the emergency room right away. If you're not feeling better in a  few days, follow up with your primary care provider.

## 2024-01-30 NOTE — ED Provider Notes (Signed)
 [facility]    CSN: [id number] Arrival date & time: 01/30/24  9161      History   Chief Complaint Chief Complaint  Patient presents with   Chest Pain   Shortness of Breath   Covid Exposure   SEXUALLY TRANSMITTED DISEASE    HPI Ashley Morris is a 28 y.o. female.   Discussed the use of AI scribe software for clinical note transcription with the patient, who gave verbal consent to proceed.   The patient presents with two primary concerns: respiratory symptoms and a genital lesion. The patient reports waking up 2 days ago with a mild cough that progressively worsened. Today, the patient is experiencing shortness of breath, chest pain when coughing, and productive cough with thick phlegm. 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 Associated symptoms include body aches in the chest and back, as well as increased frequency of soft stools. The patient has attempted self-treatment with Tylenol  and turmeric tea with honey.  Additionally, the patient reports a painful, hard bump on the lip of the vagina, which developed following a visit to this clinic 2 weeks ago for STD testing. At that time, small bumps were noted on the vagina, and the patient tested positive for a yeast infection and BV, for which treatment was completed. The patient denies any vaginal discharge, itching, irritation, or odor associated with the current lesion.  The patient denies fever, chills, runny nose, sneezing, sore throat, or headaches. The patient typically smokes cigarettes but has abstained due to respiratory symptoms. The patient is sexually active with both female and female partners in the past 3 months, using condoms inconsistently.  The following sections of the patient's history were reviewed and updated as appropriate: allergies, current medications, past family history, past medical  history, past social history, past surgical history, and problem list.         Past Medical History:  Diagnosis Date   Abdominal pain in pregnancy, antepartum    Asthma    Decreased fetal movement affecting management of mother, antepartum 10/07/2014   Equivocal NST in clinic, had 10x10 accels but not reactive by criteria. Consulted Dr Eveline who advises she go to [facility]. Patient refuses due to transportation issues. States will come tomorrow.  Risks of fetal distress and still birth discussed.    Supervision of normal pregnancy in second trimester 06/17/2014    Clinic  HR Clinic Prenatal Labs  Dating  6 wk ultrasound Blood type: O/POS/-- (05/19 1516)   Genetic Screen  too late, no visits after 1st until 32w Antibody:NEG (05/19 1516)  Anatomic US   Normal at 20 wks, but limited face, spine > rescan in 4-6 wks > not done (currently 39 wks) Rubella: 1.64 (05/19 1516)  GTT Early:               Third trimester: 96 RPR: NON REAC (05/19 1516)   Flu vaccine  Dec   Urinary tract infection    UTI (urinary tract infection)     Patient Active Problem List   Diagnosis Date Noted   Vaginal discharge 07/29/2023   Normal labor 09/28/2021   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   Abnormal GTT (glucose tolerance test) 08/04/2021   Susceptible to varicella (non-immune), currently pregnant 06/09/2021   Supervision of high risk pregnancy, antepartum 06/08/2021   Limited prenatal care 06/08/2021   Routine screening for STI (sexually transmitted  infection)    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 Authorizing Provider  acetaminophen  (TYLENOL ) 325 MG tablet Take 650  mg by mouth every 6 (six) hours as needed.   Yes [provider]  albuterol  (VENTOLIN  HFA) 108 (90 Base) MCG/ACT inhaler Inhale 2 puffs into the lungs every 4 (four) hours as needed for wheezing or shortness of breath (bronchospasms). 01/30/24  Yes Haris Baack, FNP  benzonatate  (TESSALON ) 200 MG capsule Take 1 capsule (200 mg total) by mouth 3 (three) times daily as needed for cough. 01/30/24  Yes Iola Lukes, FNP  Dextromethorphan -guaiFENesin  (MUCINEX  DM MAXIMUM STRENGTH) 60-1200 MG TB12 Take 1 tablet by mouth 2 (two) times daily. 01/30/24  Yes Iola Lukes, FNP    Family History Family History  Problem Relation Age of Onset   Hypertension Mother    Anemia Mother     Social History Social History   Tobacco Use   Smoking status: Some Days    Types: Cigarettes    Passive exposure: Current   Smokeless tobacco: Never  Vaping Use   Vaping status: Never Used  Substance Use Topics   Alcohol use: Not Currently   Drug use: Yes    Types: Marijuana    Comment: about a month ago     Allergies   Patient has no known allergies.   Review of Systems Review of Systems  Constitutional:  Negative for chills, diaphoresis and fever.  HENT:  Negative for congestion, rhinorrhea, sneezing and sore throat.   Respiratory:  Positive for cough, chest tightness and shortness of breath. Negative for wheezing.   Cardiovascular:  Positive for chest pain (chest hurts when coughing).  Gastrointestinal:  Negative for diarrhea, nausea and vomiting.  Genitourinary:  Positive for genital sores (several nontender bumps on labia with one that is painful and hard) and menstrual problem (irregular cycles due to nexplanon ). Negative for vaginal discharge.       No vaginal itching, irritation, or odor    Musculoskeletal:  Positive for back pain (back hurts when coughing). Negative for myalgias.  Neurological:  Negative for headaches.  All other systems reviewed and are  negative.    Physical Exam Triage Vital Signs ED Triage Vitals  Encounter Vitals Group     BP 01/30/24 0934 133/86     Girls Systolic BP Percentile --      Girls Diastolic BP Percentile --      Boys Systolic BP Percentile --      Boys Diastolic BP Percentile --      Pulse Rate 01/30/24 0934 (!) 112     Resp 01/30/24 0934 20     Temp 01/30/24 0934 97.8 F (36.6 C)     Temp Source 01/30/24 0934 Oral     SpO2 01/30/24 0934 97 %     Weight 01/30/24 0934 170 lb (77.1 kg)  Height --      Head Circumference --      Peak Flow --      Pain Score 01/30/24 0932 8     Pain Loc --      Pain Education --      Exclude from Growth Chart --    No data found.  Updated Vital Signs BP 133/86 (BP Location: Left Arm)   Pulse 95   Temp 97.8 F (36.6 C) (Oral)   Resp 20   Wt 170 lb (77.1 kg)   LMP 01/11/2024 (Approximate)   SpO2 97%   BMI 26.63 kg/m   Visual Acuity Right Eye Distance:   Left Eye Distance:   Bilateral Distance:    Right Eye Near:   Left Eye Near:    Bilateral Near:     Physical Exam Vitals reviewed. Exam conducted with a chaperone present Dub Love, CMA).  Constitutional:      General: She is awake. She is not in acute distress.    Appearance: Normal appearance. She is well-developed. She is not ill-appearing, toxic-appearing or diaphoretic.  HENT:     Head: Normocephalic.     Right Ear: Hearing normal.     Left Ear: Hearing normal.     Nose: Nose normal.     Mouth/Throat:     Mouth: Mucous membranes are moist.  Eyes:     General: Vision grossly intact.     Conjunctiva/sclera: Conjunctivae normal.  Cardiovascular:     Rate and Rhythm: Normal rate and regular rhythm.     Heart sounds: Normal heart sounds.  Pulmonary:     Effort: Pulmonary effort is normal. No tachypnea or respiratory distress.     Breath sounds: Normal air entry. Wheezing (diffusely throughout all lung fields) present.     Comments: Respirations even and unlabored  Abdominal:      Palpations: Abdomen is soft.  Genitourinary:    Labia:        Right: Lesion (a small rounded lesion on the right upper labia. Lesion is firm, smooth, and non-tender without surrounding erythema, swelling, drainage, or underlying fluctuance.) present.       Comments: Patient performed self-swab for Aptima testing.  Musculoskeletal:        General: Normal range of motion.     Cervical back: Full passive range of motion without pain, normal range of motion and neck supple.  Skin:    General: Skin is warm and dry.  Neurological:     General: No focal deficit present.     Mental Status: She is alert and oriented to person, place, and time.  Psychiatric:        Mood and Affect: Mood normal.        Speech: Speech normal.        Behavior: Behavior normal. Behavior is cooperative.      UC Treatments / Results  Labs (all labs ordered are listed, but only abnormal results are displayed) Labs Reviewed  POC SOFIA SARS ANTIGEN FIA - Normal  RPR  HIV ANTIBODY (ROUTINE TESTING W REFLEX)  POCT URINE PREGNANCY  CERVICOVAGINAL ANCILLARY ONLY    EKG   Radiology No results found.  Procedures Procedures (including critical care time)  Medications Ordered in UC Medications  ipratropium-albuterol  (DUONEB) 0.5-2.5 (3) MG/3ML nebulizer solution 3 mL (3 mLs Nebulization Given 01/30/24 1113)    Initial Impression / Assessment and Plan / UC Course  I have reviewed the triage vital signs and the nursing notes.  Pertinent  labs & imaging results that were available during my care of the patient were reviewed by me and considered in my medical decision making (see chart for details).     The patient presents with symptoms consistent with a viral upper respiratory infection. She is afebrile and nontoxic. Wheezing noted on exam but no acute distress, respirations even and unlabored. COVID testing negative. DUONEB given in clinic with improvement in wheezing. Exam is reassuring and no evidence of  bacterial infection or acute cardiopulmonary process is noted. Supportive care is recommended. Patient was advised to follow up with primary care if symptoms do not improve within one week or if new concerns arise. Instructions were given to seek emergency care if symptoms worsen, including shortness of breath, chest pain, persistent high fever, inability to tolerate fluids, or confusion.  Patient also presents with a vulvar lesion. On exam, the lesion does not appear consistent with an abscess or cellulitis. Most likely etiology is a follicular/hair bump; less likely, HSV infection. A swab for HSV testing was obtained for confirmation. No systemic symptoms or concerning features at this time. Supportive care recommended with warm moist compresses, avoidance of shaving or hair removal products, and counseling against manipulation of the lesion. Patient advised to monitor for worsening pain, swelling, drainage, fever, or new lesions and to return or seek further care if these occur. Disposition is stable for outpatient follow-up pending swab results.  Patient presents for STD testing. Tests obtained today include gonorrhea, chlamydia, trichomonas, HIV, and syphilis. Results are pending. Patient was counseled to abstain from sexual  activity until all results have been received, any necessary treatment has been completed, and any symptoms, if present, have resolved. Safe sex practices were discussed and encouraged, including consistent condom use and regular screening with new partners. Patient advised they will only be contacted if any results are positive or require follow-up; otherwise, they may review their results through MyChart.  Today's evaluation has revealed no signs of a dangerous process. Discussed diagnosis with patient and/or guardian. Patient and/or guardian aware of their diagnosis, possible red flag symptoms to watch out for and need for close follow up. Patient and/or guardian understands  verbal and written discharge instructions. Patient and/or guardian comfortable with plan and disposition.  Patient and/or guardian has a clear mental status at this time, good insight into illness (after discussion and teaching) and has clear judgment to make decisions regarding their care  Documentation was completed with the aid of voice recognition software. Transcription may contain typographical errors.    Final Clinical Impressions(s) / UC Diagnoses   Final diagnoses:  Screening for STD (sexually transmitted disease)  Lesion of vulva  Upper respiratory tract infection, unspecified type  Contact with and (suspected) exposure to covid-19     Discharge Instructions      Your symptoms are most likely caused by a respiratory infection, which affects areas like your nose, throat, or lungs. This type of infection is usually caused by a virus. Since your illness is caused by a virus, antibiotics won't help because they only treat infections caused by bacteria. Take the medications that were prescribed to you as directed. If you have a fever, headache, or body aches, you can also take Tylenol  or ibuprofen  to help you feel more comfortable. Be sure to drink plenty of fluids to stay hydrated--aim for enough to keep your urine a pale yellow color. This will also help to thin mucus and make it easier to clear from your body.   Using  a cool mist humidifier at home to keep humidity levels above 50% can be helpful. You can also inhale steam for 10 to 15 minutes, 3 to 4 times a day. This can be done by sitting in the bathroom with a hot shower running, or by using over-the-counter vapor shower tablets to help with nasal congestion. Try to avoid cool or dry air as much as possible. When you sleep, keep your head elevated to help reduce post-nasal drainage. Be sure to get enough rest every night to support your recovery.Don't forget to replace your toothbrush once you start feeling better.   It's normal  for a cough to linger for several weeks after a respiratory illness, even after other symptoms have resolved. This happens because the airways remain irritated and take time to fully heal. As long as the cough gradually improves and there are no new concerning symptoms, this is part of the normal recovery process.  You also requesting STI screen. Testing for gonorrhea, chlamydia, trichomonas, HIV and syphilis is pending. You should not have any sexual activity until you receive the results of the tests. You will only be notified for positive results. You may go online to MyChart and review your results. Practice safe sex practices by wearing a condom every time you have sex. Remember that people who have STIs may not experience any symptoms. However, even without symptoms, these infections can be spread from person to person and require treatment. STIs can be treated, and many STIs can be cured. However, some STIs cannot be cured and will affect you for the rest of your life. It's important to be checked regularly for STIs. You should also consider taking pre-exposure prophylaxis (PrEP) to prevent HIV infection.  A small lesion was noted on the vulva today. It does not appear to be an abscess or active infection. The most likely cause is a hair bump, though a herpes (HSV) infection is less likely but still possible. A swab has been collected to test for HSV, and you will be contacted with the results if positive. In the meantime, you may apply moist, warm compresses to the area several times a day to help with comfort and healing. Do not mash, squeeze, or attempt to pop the lesion. Please avoid shaving or using hair removal creams in the area until the lesion has fully healed, as this may worsen irritation or introduce infection. Monitor the area closely. If you notice increased redness, swelling, drainage of pus, spreading pain, fever, or if you develop new lesions, please seek medical attention promptly.  Otherwise, follow up as directed to review your test results and discuss next steps.  If your symptoms get worse or if you develop any new or concerning symptoms, go to the emergency room right away. If you're not feeling better in a few days, follow up with your primary care provider.       ED Prescriptions     Medication Sig Dispense Auth. Provider   Dextromethorphan -guaiFENesin  (MUCINEX  DM MAXIMUM STRENGTH) 60-1200 MG TB12 Take 1 tablet by mouth 2 (two) times daily. 20 tablet Iola Lukes, FNP   albuterol  (VENTOLIN  HFA) 108 (90 Base) MCG/ACT inhaler Inhale 2 puffs into the lungs every 4 (four) hours as needed for wheezing or shortness of breath (bronchospasms). 18 g Iola Lukes, FNP   benzonatate  (TESSALON ) 200 MG capsule Take 1 capsule (200 mg total) by mouth 3 (three) times daily as needed for cough. 30 capsule Iola Lukes, FNP      PDMP  not reviewed this encounter.   Iola Lukes, [place] 01/30/24 1152

## 2024-01-30 NOTE — ED Triage Notes (Addendum)
 Pt presents c/o COVID exposure, productive cough, and chest pains x 3 days. 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 Pt feels she has gotten exposed via a resident. Pt denies emesis and diarrhea.     Pt also c/o a bump in vaginal area that now has pain to the touch. Pt is requesting STD testing including blood work.

## 2024-01-31 ENCOUNTER — Ambulatory Visit: Payer: Self-pay | Admitting: Nurse Practitioner

## 2024-01-31 LAB — CERVICOVAGINAL ANCILLARY ONLY
Bacterial Vaginitis (gardnerella): NEGATIVE
Candida Glabrata: POSITIVE — AB
Candida Vaginitis: NEGATIVE
Chlamydia: NEGATIVE
Comment: NEGATIVE
Comment: NEGATIVE
Comment: NEGATIVE
Comment: NEGATIVE
Comment: NEGATIVE
Comment: NORMAL
Neisseria Gonorrhea: NEGATIVE
Trichomonas: NEGATIVE

## 2024-01-31 LAB — RPR: RPR Ser Ql: NONREACTIVE

## 2024-01-31 LAB — HIV ANTIBODY (ROUTINE TESTING W REFLEX): HIV Screen 4th Generation wRfx: NONREACTIVE

## 2024-01-31 MED ORDER — CLOTRIMAZOLE 2 % VA CREA: 1.0000 | TOPICAL_CREAM | Freq: Every day | VAGINAL | 0 refills | Status: AC

## 2024-03-19 ENCOUNTER — Ambulatory Visit: Admission: EM | Admit: 2024-03-19 | Discharge: 2024-03-19 | Disposition: A | Discharge status: Home / Self Care

## 2024-03-19 ENCOUNTER — Encounter: Payer: Self-pay | Admitting: Emergency Medicine

## 2024-03-19 DIAGNOSIS — N898 Other specified noninflammatory disorders of vagina: Secondary | ICD-10-CM | POA: Diagnosis not present

## 2024-03-19 DIAGNOSIS — Z575 Occupational exposure to toxic agents in other industries: Secondary | ICD-10-CM | POA: Insufficient documentation

## 2024-03-19 DIAGNOSIS — R21 Rash and other nonspecific skin eruption: Secondary | ICD-10-CM | POA: Insufficient documentation

## 2024-03-19 LAB — POCT URINE PREGNANCY: Preg Test, Ur: NEGATIVE

## 2024-03-19 MED ORDER — FLUCONAZOLE 150 MG PO TABS: 150.0000 mg | ORAL_TABLET | Freq: Every day | ORAL | 0 refills | Status: AC

## 2024-03-19 MED ORDER — TRIAMCINOLONE ACETONIDE 0.1 % EX CREA: 1.0000 | TOPICAL_CREAM | Freq: Two times a day (BID) | CUTANEOUS | 0 refills | Status: AC

## 2024-03-19 NOTE — ED Provider Notes (Signed)
 [facility]  Note:  This document was prepared using Conservation officer, historic buildings and may include unintentional dictation errors.  MRN: [id number] DOB: 27-Jul-1995  Subjective:   Ashley Morris is a 28 y.o. female presenting for evaluation for persistent vaginal itching and thick white discharge x 2 to 3 weeks.  Patient reports that she was initially seen in urgent care on 01/30/2024 for the same symptoms tested positive for yeast infection and prescription was sent to her pharmacy.  Patient reports that she did not have money for prescription clotrimazole  at that time and when she did get the money and went to pick the medication up the prescription was no longer valid.  Patient is here for evaluation and possible treatment for vaginal Candida infection.  Patient denies using any over-the-counter treatment prior to arrival in urgent care today.  Patient also reports bilateral lower extremity rash/discoloration to the skin x 2 weeks.  Patient denies any change in detergents, soaps, medications in that time span.  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  Patient was wondering if there is any prescribed cream or lotion that may help symptoms.  Patient reports that she does have a dermatology appointment in the next couple weeks but wanted to see if there was any topical treatment that may help symptoms prior to follow-up with dermatology.  No current facility-administered medications for this encounter.  Current Outpatient Medications:    fluconazole  (DIFLUCAN ) 150 MG tablet, Take 1 tablet (150 mg total) by mouth daily. If symptoms persist take second dose of Diflucan  150 mg on day 3 to treat vaginal Candida., Disp: 2 tablet, Rfl: 0   triamcinolone  cream (KENALOG ) 0.1 %, Apply 1 Application topically 2 (two) times daily., Disp: 45 g, Rfl: 0   acetaminophen  (TYLENOL ) 325 MG tablet, Take 650 mg by  mouth every 6 (six) hours as needed. (Patient not taking: Reported on 03/19/2024), Disp: , Rfl:    albuterol  (VENTOLIN  HFA) 108 (90 Base) MCG/ACT inhaler, Inhale 2 puffs into the lungs every 4 (four) hours as needed for wheezing or shortness of breath (bronchospasms). (Patient not taking: Reported on 03/19/2024), Disp: 18 g, Rfl: 0   benzonatate  (TESSALON ) 200 MG capsule, Take 1 capsule (200 mg total) by mouth 3 (three) times daily as needed for cough. (Patient not taking: Reported on 03/19/2024), Disp: 30 capsule, Rfl: 0   Dextromethorphan -guaiFENesin  (MUCINEX  DM MAXIMUM STRENGTH) 60-1200 MG TB12, Take 1 tablet by mouth 2 (two) times daily. (Patient not taking: Reported on 03/19/2024), Disp: 20 tablet, Rfl: 0   hydrocortisone 2.5 % cream, Apply topically. (Patient not taking: Reported on 03/19/2024), Disp: , Rfl:    No Known Allergies  Past Medical History:  Diagnosis Date   Abdominal pain in pregnancy, antepartum    Asthma    Decreased fetal movement affecting management of mother, antepartum 10/07/2014   Equivocal NST in clinic, had 10x10 accels but not reactive by criteria. Consulted Dr Eveline who advises she go to [facility] for [facility]. Patient refuses due to transportation issues. States will come tomorrow.  Risks of fetal distress and still birth discussed.    Supervision of normal pregnancy in second trimester 06/17/2014    Clinic  HR Clinic Prenatal Labs  Dating  6 wk ultrasound Blood type: O/POS/-- (05/19 1516)   Genetic Screen  too late, no visits after 1st until 32w Antibody:NEG (05/19 1516)  Anatomic US   Normal at  20 wks, but limited face, spine > rescan in 4-6 wks > not done (currently 39 wks) Rubella: 1.64 (05/19 1516)  GTT Early:               Third trimester: 96 RPR: NON REAC (05/19 1516)   Flu vaccine  Dec   Urinary tract infection    UTI (urinary tract infection)      Past Surgical History:  Procedure Laterality Date   NO PAST SURGERIES      Family History  Problem Relation Age of  Onset   Hypertension Mother    Anemia Mother     Social History   Tobacco Use   Smoking status: Some Days    Types: Cigarettes    Passive exposure: Current   Smokeless tobacco: Never  Vaping Use   Vaping status: Never Used  Substance Use Topics   Alcohol use: Not Currently   Drug use: Yes    Types: Marijuana    Comment: about a month ago    ROS Refer to HPI for ROS details.  Objective:   Vitals: BP 120/74 (BP Location: Left Arm)   Pulse 76   Temp 97.8 F (36.6 C) (Oral)   Resp 18   LMP  (LMP Unknown)   SpO2 97%   Physical Exam Vitals and nursing note reviewed.  Constitutional:      General: She is not in acute distress.    Appearance: Normal appearance. She is not ill-appearing.  HENT:     Head: Normocephalic.  Cardiovascular:     Rate and Rhythm: Normal rate.  Pulmonary:     Effort: Pulmonary effort is normal. No respiratory distress.  Genitourinary:    Vagina: Vaginal discharge present.  Skin:    General: Skin is warm and dry.     Capillary Refill: Capillary refill takes less than 2 seconds.     Findings: Erythema and rash present. Rash is papular.      Neurological:     General: No focal deficit present.     Mental Status: She is alert and oriented to person, place, and time.  Psychiatric:        Mood and Affect: Mood normal.        Behavior: Behavior normal.     Procedures  Results for orders placed or performed during the hospital encounter of 03/19/24 (from the past 24 hours)  POCT urine pregnancy     Status: Normal   Collection Time: 03/19/24  1:57 PM  Result Value Ref Range   Preg Test, Ur Negative Negative    No results found.   Assessment and Plan :     Discharge Instructions       1. Vaginal discharge (Primary) - POCT urine pregnancy complete in UC is negative - Cervicovaginal collected in UC and sent to lab for further testing results should be available in 2 to 3 days. - fluconazole  (DIFLUCAN ) 150 MG tablet; Take 1  tablet (150 mg total) by mouth daily. If symptoms persist take second dose of Diflucan  150 mg on day 3 to treat vaginal Candida.  Dispense: 2 tablet; Refill: 0  2. Rash and nonspecific skin eruption - triamcinolone  cream (KENALOG ) 0.1 %; Apply 1 Application topically 2 (two) times daily.  Dispense: 45 g; Refill: 0 - Follow-up with dermatologist as scheduled for further evaluation and ongoing management of lower extremity skin rash.  -Continue to monitor symptoms for any change in severity if there is any escalation of current symptoms or development  of new symptoms follow-up in ER for further evaluation and management.      Torrell Krutz B Crystallynn Noorani   Sharlie Shreffler, [place], [place] 03/19/24 1437

## 2024-03-19 NOTE — ED Triage Notes (Signed)
 Pt reports vaginal itching and white,clumpy discharge. Pt was seen at [facility] on 9/11 with same symptoms - tested positive for a yeast infection. Was sent out a prescription of clotrimazole , but it was never picked up. Pt reports she waited too late and when she went to pick it up pharmacy stated it was no longer available and she would have to be seen again. Pt reports symptoms are the same just asking for medication to be sent in again.   Reports increasing discoloration rash to both lower legs x 2weeks.  No new irritants - detergents, soaps, bug bites, etc but she does work as financial trader. Reports she has always had this discoloration but it seems to be getting worse recently. Pt curious if there is a cream or lotion that could help her.

## 2024-03-19 NOTE — Discharge Instructions (Signed)
  1. Vaginal discharge (Primary) - POCT urine pregnancy complete in UC is negative - Cervicovaginal collected in UC and sent to lab for further testing results should be available in 2 to 3 days. - fluconazole  (DIFLUCAN ) 150 MG tablet; Take 1 tablet (150 mg total) by mouth daily. If symptoms persist take second dose of Diflucan  150 mg on day 3 to treat vaginal Candida.  Dispense: 2 tablet; Refill: 0  2. Rash and nonspecific skin eruption - triamcinolone  cream (KENALOG ) 0.1 %; Apply 1 Application topically 2 (two) times daily.  Dispense: 45 g; Refill: 0 - Follow-up with dermatologist as scheduled for further evaluation and ongoing management of lower extremity skin rash.  -Continue to monitor symptoms for any change in severity if there is any escalation of current symptoms or development of new symptoms follow-up in ER for further evaluation and management.

## 2024-03-23 ENCOUNTER — Ambulatory Visit (HOSPITAL_COMMUNITY): Payer: Self-pay

## 2024-03-23 LAB — CERVICOVAGINAL ANCILLARY ONLY
Bacterial Vaginitis (gardnerella): POSITIVE — AB
Candida Glabrata: NEGATIVE
Candida Vaginitis: NEGATIVE
Chlamydia: NEGATIVE
Comment: NEGATIVE
Comment: NEGATIVE
Comment: NEGATIVE
Comment: NEGATIVE
Comment: NEGATIVE
Comment: NORMAL
Neisseria Gonorrhea: NEGATIVE
Trichomonas: NEGATIVE

## 2024-03-23 MED ORDER — METRONIDAZOLE 500 MG PO TABS: 500.0000 mg | ORAL_TABLET | Freq: Two times a day (BID) | ORAL | 0 refills | Status: AC

## 2024-06-11 ENCOUNTER — Encounter: Admitting: Family Medicine
